# Patient Record
Sex: Female | Born: 1955
Health system: Southern US, Community
[De-identification: ages and names within clinical notes are randomized; demographics above are authoritative.]

## PROBLEM LIST (undated history)

## (undated) DIAGNOSIS — G459 Transient cerebral ischemic attack, unspecified: Secondary | ICD-10-CM

## (undated) DIAGNOSIS — E669 Obesity, unspecified: Secondary | ICD-10-CM

## (undated) DIAGNOSIS — G479 Sleep disorder, unspecified: Secondary | ICD-10-CM

## (undated) DIAGNOSIS — F419 Anxiety disorder, unspecified: Secondary | ICD-10-CM

## (undated) DIAGNOSIS — K5900 Constipation, unspecified: Secondary | ICD-10-CM

## (undated) DIAGNOSIS — I10 Essential (primary) hypertension: Secondary | ICD-10-CM

## (undated) DIAGNOSIS — E785 Hyperlipidemia, unspecified: Secondary | ICD-10-CM

## (undated) DIAGNOSIS — E119 Type 2 diabetes mellitus without complications: Secondary | ICD-10-CM

## (undated) DIAGNOSIS — J45909 Unspecified asthma, uncomplicated: Secondary | ICD-10-CM

## (undated) DIAGNOSIS — M1990 Unspecified osteoarthritis, unspecified site: Secondary | ICD-10-CM

## (undated) DIAGNOSIS — T7840XA Allergy, unspecified, initial encounter: Secondary | ICD-10-CM

## (undated) DIAGNOSIS — D649 Anemia, unspecified: Secondary | ICD-10-CM

## (undated) DIAGNOSIS — M109 Gout, unspecified: Secondary | ICD-10-CM

## (undated) HISTORY — DX: Gout, unspecified: M10.9

## (undated) HISTORY — DX: Anxiety disorder, unspecified: F41.9

## (undated) HISTORY — DX: Allergy, unspecified, initial encounter: T78.40XA

## (undated) HISTORY — PX: OTHER SURGICAL HISTORY: SHX169

## (undated) HISTORY — DX: Unspecified asthma, uncomplicated: J45.909

## (undated) HISTORY — DX: Essential (primary) hypertension: I10

## (undated) HISTORY — DX: Sleep disorder, unspecified: G47.9

## (undated) HISTORY — PX: KNEE ARTHROSCOPY: SUR90

## (undated) HISTORY — DX: Constipation, unspecified: K59.00

## (undated) HISTORY — DX: Obesity, unspecified: E66.9

## (undated) HISTORY — DX: Unspecified osteoarthritis, unspecified site: M19.90

## (undated) HISTORY — PX: COLONOSCOPY: SHX174

## (undated) HISTORY — PX: POLYPECTOMY: SHX149

## (undated) HISTORY — DX: Transient cerebral ischemic attack, unspecified: G45.9

## (undated) HISTORY — DX: Hyperlipidemia, unspecified: E78.5

## (undated) HISTORY — DX: Type 2 diabetes mellitus without complications: E11.9

## (undated) HISTORY — DX: Anemia, unspecified: D64.9

---

## 1998-02-15 ENCOUNTER — Other Ambulatory Visit: Admission: RE | Admit: 1998-02-15 | Discharge: 1998-02-15 | Payer: Self-pay | Admitting: *Deleted

## 1998-03-08 ENCOUNTER — Ambulatory Visit (HOSPITAL_COMMUNITY): Admission: RE | Admit: 1998-03-08 | Discharge: 1998-03-08 | Payer: Self-pay | Admitting: *Deleted

## 1998-03-08 ENCOUNTER — Encounter: Payer: Self-pay | Admitting: *Deleted

## 1998-03-14 ENCOUNTER — Ambulatory Visit (HOSPITAL_COMMUNITY): Admission: RE | Admit: 1998-03-14 | Discharge: 1998-03-14 | Payer: Self-pay | Admitting: *Deleted

## 1998-03-14 ENCOUNTER — Encounter: Payer: Self-pay | Admitting: *Deleted

## 1998-04-09 ENCOUNTER — Encounter: Admission: RE | Admit: 1998-04-09 | Discharge: 1998-07-08 | Payer: Self-pay | Admitting: Family Medicine

## 1999-03-18 ENCOUNTER — Encounter: Payer: Self-pay | Admitting: *Deleted

## 1999-03-18 ENCOUNTER — Ambulatory Visit (HOSPITAL_COMMUNITY): Admission: RE | Admit: 1999-03-18 | Discharge: 1999-03-18 | Payer: Self-pay | Admitting: *Deleted

## 1999-12-05 ENCOUNTER — Emergency Department (HOSPITAL_COMMUNITY): Admission: EM | Admit: 1999-12-05 | Discharge: 1999-12-05 | Payer: Self-pay | Admitting: Emergency Medicine

## 2000-03-23 ENCOUNTER — Ambulatory Visit (HOSPITAL_COMMUNITY): Admission: RE | Admit: 2000-03-23 | Discharge: 2000-03-23 | Payer: Self-pay | Admitting: *Deleted

## 2000-03-23 ENCOUNTER — Encounter: Payer: Self-pay | Admitting: *Deleted

## 2003-10-19 ENCOUNTER — Emergency Department (HOSPITAL_COMMUNITY): Admission: EM | Admit: 2003-10-19 | Discharge: 2003-10-19 | Payer: Self-pay | Admitting: *Deleted

## 2004-03-24 ENCOUNTER — Emergency Department (HOSPITAL_COMMUNITY): Admission: EM | Admit: 2004-03-24 | Discharge: 2004-03-24 | Payer: Self-pay | Admitting: Family Medicine

## 2007-08-10 ENCOUNTER — Ambulatory Visit: Payer: Self-pay | Admitting: Internal Medicine

## 2007-08-25 ENCOUNTER — Encounter: Payer: Self-pay | Admitting: Internal Medicine

## 2007-08-25 ENCOUNTER — Ambulatory Visit: Payer: Self-pay | Admitting: Internal Medicine

## 2007-08-26 ENCOUNTER — Encounter: Payer: Self-pay | Admitting: Internal Medicine

## 2007-09-14 ENCOUNTER — Ambulatory Visit (HOSPITAL_COMMUNITY): Admission: RE | Admit: 2007-09-14 | Discharge: 2007-09-14 | Payer: Self-pay | Admitting: Obstetrics and Gynecology

## 2007-09-14 ENCOUNTER — Encounter (INDEPENDENT_AMBULATORY_CARE_PROVIDER_SITE_OTHER): Payer: Self-pay | Admitting: Obstetrics and Gynecology

## 2009-06-02 ENCOUNTER — Emergency Department (HOSPITAL_COMMUNITY): Admission: EM | Admit: 2009-06-02 | Discharge: 2009-06-02 | Payer: Self-pay | Admitting: Family Medicine

## 2010-02-18 ENCOUNTER — Emergency Department (HOSPITAL_COMMUNITY)
Admission: EM | Admit: 2010-02-18 | Discharge: 2010-02-18 | Payer: Self-pay | Source: Home / Self Care | Admitting: Emergency Medicine

## 2010-06-03 NOTE — Op Note (Signed)
NAMEJAVIANA, Dorothy Torres               ACCOUNT NO.:  [id number]   MEDICAL RECORD NO.:  [id number]          PATIENT TYPE:  AMB   LOCATION:  SDC                           FACILITY:  [facility]   PHYSICIAN:  Carrington Clamp, M.D. DATE OF BIRTH:  08-10-1955   DATE OF PROCEDURE:  09/14/2007  DATE OF DISCHARGE:                               OPERATIVE REPORT   PREOPERATIVE DIAGNOSIS:  Postmenopausal bleeding.   POSTOPERATIVE DIAGNOSES:  1. Postmenopausal bleeding.  2. Endometrial polyp.   PROCEDURE:  Dilation and curettage with hysteroscopy.   SURGEON:  Carrington Clamp, MD.   ASSISTANT:  None.   ANESTHESIA:  General.   FINDINGS:  A 2-cm polyp in the fundus of the uterus that was easily  removed.   SPECIMENS:  Uterine curettings.   DISPOSITION:  To pathology.   ESTIMATED BLOOD LOSS:  Minimal.   IV FLUIDS:  700 mL.   URINE OUTPUT:  Not measured.   COMPLICATIONS:  None.   MEDICATIONS:  None.   Counts were correct x3.   TECHNIQUE:  After adequate general anesthesia was achieved, the patient  was prepped and draped in the usual sterile fashion in dorsal lithotomy  position.  Bladder was emptied with a red rubber catheter.  A speculum  was placed in the vagina.  Single-tooth tenaculum was used to stabilize  the cervix.  The cervix was dilated with Shawnie Pons dilators.  The  hysteroscope was passed into the uterine cavity and the above findings  noted, including the ostia of the uterus.  The polyp forceps were used  to remove the polyp and curettage was then performed using a sharp  curettage.  The hysteroscope was passed again into the uterine cavity to ensure that  all the polyp had been removed and had all instruments withdrawn from  the vagina and the patient tolerated the procedure well.  She returned  to recovery room in stable condition.      Carrington Clamp, M.D.  Electronically Signed     MH/MEDQ  D:  09/14/2007  T:  09/14/2007  Job:  [id number]

## 2010-08-26 ENCOUNTER — Other Ambulatory Visit (HOSPITAL_COMMUNITY): Payer: Self-pay | Admitting: Internal Medicine

## 2010-08-26 DIAGNOSIS — Z1231 Encounter for screening mammogram for malignant neoplasm of breast: Secondary | ICD-10-CM

## 2010-08-29 ENCOUNTER — Ambulatory Visit (HOSPITAL_COMMUNITY)
Admission: RE | Admit: 2010-08-29 | Discharge: 2010-08-29 | Disposition: A | Discharge status: Home / Self Care | Payer: BC Managed Care – PPO | Source: Ambulatory Visit | Attending: Internal Medicine | Admitting: Internal Medicine

## 2010-08-29 DIAGNOSIS — Z1231 Encounter for screening mammogram for malignant neoplasm of breast: Secondary | ICD-10-CM

## 2010-12-30 ENCOUNTER — Other Ambulatory Visit: Payer: Self-pay | Admitting: Obstetrics and Gynecology

## 2011-10-21 ENCOUNTER — Other Ambulatory Visit (HOSPITAL_COMMUNITY): Payer: Self-pay | Admitting: Internal Medicine

## 2011-10-21 DIAGNOSIS — Z1231 Encounter for screening mammogram for malignant neoplasm of breast: Secondary | ICD-10-CM

## 2011-10-27 ENCOUNTER — Encounter: Payer: BC Managed Care – PPO | Attending: Internal Medicine | Admitting: *Deleted

## 2011-10-27 DIAGNOSIS — Z713 Dietary counseling and surveillance: Secondary | ICD-10-CM | POA: Insufficient documentation

## 2011-10-27 DIAGNOSIS — E119 Type 2 diabetes mellitus without complications: Secondary | ICD-10-CM | POA: Insufficient documentation

## 2011-10-28 ENCOUNTER — Encounter: Payer: Self-pay | Admitting: *Deleted

## 2011-10-28 NOTE — Progress Notes (Signed)
  Patient was seen on 10/27/2011 for the first of a series of three diabetes self-management courses at the [facility]. The following learning objectives were met by the patient during this course:   Defines the role of glucose and insulin  Identifies type of diabetes and pathophysiology  Defines the diagnostic criteria for diabetes and prediabetes  States the risk factors for Type 2 Diabetes  States the symptoms of Type 2 Diabetes  Defines Type 2 Diabetes treatment goals  Defines Type 2 Diabetes treatment options  States the rationale for glucose monitoring  Identifies A1C, glucose targets, and testing times  Identifies proper sharps disposal  Defines the purpose of a diabetes food plan  Identifies carbohydrate food groups  Defines effects of carbohydrate foods on glucose levels  Identifies carbohydrate choices/grams/food labels  States benefits of physical activity and effect on glucose  Review of suggested activity guidelines  Handouts given during class include:  Type 2 Diabetes: Basics Book  My Food Plan Book  Food and Activity Log  Follow-Up Plan: Core Class 2

## 2011-10-28 NOTE — Patient Instructions (Signed)
Goals:  Follow Diabetes Meal Plan as instructed  Eat 3 meals and 2 snacks, every 3-5 hrs  Limit carbohydrate intake to 30-45 grams carbohydrate/meal  Limit carbohydrate intake to 0-15 grams carbohydrate/snack  Add lean protein foods to meals/snacks  Monitor glucose levels as instructed by your doctor  Aim for 15-30 mins of physical activity daily  Bring food record and glucose log to your next nutrition visit   

## 2011-11-03 ENCOUNTER — Ambulatory Visit (HOSPITAL_COMMUNITY): Payer: BC Managed Care – PPO

## 2011-11-16 ENCOUNTER — Ambulatory Visit (HOSPITAL_COMMUNITY): Payer: BC Managed Care – PPO | Attending: Internal Medicine

## 2011-11-17 ENCOUNTER — Encounter: Payer: BC Managed Care – PPO | Admitting: *Deleted

## 2011-11-17 DIAGNOSIS — E119 Type 2 diabetes mellitus without complications: Secondary | ICD-10-CM

## 2011-11-18 NOTE — Patient Instructions (Signed)
Goals:  Follow Diabetes Meal Plan as instructed  Eat 3 meals and 2 snacks, every 3-5 hrs  Limit carbohydrate intake to 30-45 grams carbohydrate/meal  Limit carbohydrate intake to 15 grams carbohydrate/snack  Add lean protein foods to meals/snacks  Monitor glucose levels as instructed by your doctor  Aim for 30 mins of physical activity daily  Bring food record and glucose log to your next nutrition visit 

## 2011-11-18 NOTE — Progress Notes (Signed)
  Patient was seen on 11/17/11 for the second of a series of three diabetes self-management courses at the [facility]. The following learning objectives were met by the patient during this course:   Explain basic nutrition maintenance and quality assurance  Describe causes, symptoms and treatment of hypoglycemia and hyperglycemia  Explain how to manage diabetes during illness  Describe the importance of good nutrition for health and healthy eating strategies  List strategies to follow meal plan when dining out  Describe the effects of alcohol on glucose and how to use it safely  Describe problem solving skills for day-to-day glucose challenges  Describe strategies to use when treatment plan needs to change  Identify important factors involved in successful weight loss  Describe ways to remain physically active  Describe the impact of regular activity on insulin resistance   Handouts given in class:  Refrigerator magnet for Sick Day Guidelines  [facility] Oral medication/insulin handout  Follow-Up Plan: Patient will attend the final class of the ADA Diabetes Self-Care Education.   

## 2011-11-19 ENCOUNTER — Ambulatory Visit: Payer: BC Managed Care – PPO

## 2011-12-01 ENCOUNTER — Encounter: Payer: BC Managed Care – PPO | Attending: Internal Medicine | Admitting: Dietician

## 2011-12-01 DIAGNOSIS — E119 Type 2 diabetes mellitus without complications: Secondary | ICD-10-CM | POA: Insufficient documentation

## 2011-12-01 DIAGNOSIS — IMO0001 Reserved for inherently not codable concepts without codable children: Secondary | ICD-10-CM

## 2011-12-01 DIAGNOSIS — Z713 Dietary counseling and surveillance: Secondary | ICD-10-CM | POA: Insufficient documentation

## 2011-12-02 NOTE — Progress Notes (Signed)
  Patient was seen on 12/01/2011 for the third of a series of three diabetes self-management courses at the [facility]. The following learning objectives were met by the patient during this course:    Describe how diabetes changes over time   Identify diabetes complications and ways to prevent them   Describe strategies that can promote heart health including lowering blood pressure and cholesterol   Describe strategies to lower dietary fat and sodium in the diet   Identify physical activities that benefit cardiovascular health   Evaluate success in meeting personal goal   Describe the belief that they can live successfully with diabetes day to day   Establish 2-3 goals that they will plan to diligently work on until they return for the free 29-month follow-up visit  The following handouts were given in class:  3 Month Follow Up Visit handout  Goal setting handout  Class evaluation form  Your patient has established the following 3 month goals for diabetes self-care:  Reduce fat in my diet by eating less fried foods at two or more meals a day.  Be active 30 minutes or more 5 times a week.  To manage stress practice breathing techniques, puzzles at bedtime, and saying "No".  Follow-Up Plan: Patient will attend a 3 month follow-up visit for diabetes self-management education.

## 2011-12-11 ENCOUNTER — Ambulatory Visit (HOSPITAL_COMMUNITY)
Admission: RE | Admit: 2011-12-11 | Discharge: 2011-12-11 | Disposition: A | Discharge status: Home / Self Care | Payer: BC Managed Care – PPO | Source: Ambulatory Visit | Attending: Internal Medicine | Admitting: Internal Medicine

## 2011-12-11 DIAGNOSIS — Z1231 Encounter for screening mammogram for malignant neoplasm of breast: Secondary | ICD-10-CM

## 2012-03-01 ENCOUNTER — Telehealth: Payer: Self-pay | Admitting: Medical Oncology

## 2012-03-01 NOTE — Telephone Encounter (Signed)
erroneous

## 2012-03-08 ENCOUNTER — Ambulatory Visit: Payer: BC Managed Care – PPO | Admitting: Dietician

## 2012-11-15 ENCOUNTER — Other Ambulatory Visit (HOSPITAL_COMMUNITY): Payer: Self-pay | Admitting: Internal Medicine

## 2012-11-15 DIAGNOSIS — Z1231 Encounter for screening mammogram for malignant neoplasm of breast: Secondary | ICD-10-CM

## 2012-12-13 ENCOUNTER — Ambulatory Visit (HOSPITAL_COMMUNITY): Payer: BC Managed Care – PPO | Attending: Internal Medicine

## 2013-04-17 ENCOUNTER — Other Ambulatory Visit: Payer: Self-pay | Admitting: Internal Medicine

## 2013-04-17 DIAGNOSIS — Z1231 Encounter for screening mammogram for malignant neoplasm of breast: Secondary | ICD-10-CM

## 2013-04-26 ENCOUNTER — Ambulatory Visit: Payer: BC Managed Care – PPO

## 2013-07-20 ENCOUNTER — Emergency Department (HOSPITAL_COMMUNITY): Payer: 59

## 2013-07-20 ENCOUNTER — Emergency Department (HOSPITAL_COMMUNITY)
Admission: EM | Admit: 2013-07-20 | Discharge: 2013-07-21 | Disposition: A | Discharge status: Home / Self Care | Payer: 59 | Attending: Emergency Medicine | Admitting: Emergency Medicine

## 2013-07-20 ENCOUNTER — Encounter (HOSPITAL_COMMUNITY): Payer: Self-pay | Admitting: Emergency Medicine

## 2013-07-20 DIAGNOSIS — R4789 Other speech disturbances: Secondary | ICD-10-CM | POA: Diagnosis not present

## 2013-07-20 DIAGNOSIS — I10 Essential (primary) hypertension: Secondary | ICD-10-CM | POA: Insufficient documentation

## 2013-07-20 DIAGNOSIS — IMO0002 Reserved for concepts with insufficient information to code with codable children: Secondary | ICD-10-CM | POA: Diagnosis not present

## 2013-07-20 DIAGNOSIS — R479 Unspecified speech disturbances: Secondary | ICD-10-CM

## 2013-07-20 DIAGNOSIS — R209 Unspecified disturbances of skin sensation: Secondary | ICD-10-CM | POA: Diagnosis not present

## 2013-07-20 DIAGNOSIS — Z79899 Other long term (current) drug therapy: Secondary | ICD-10-CM | POA: Insufficient documentation

## 2013-07-20 DIAGNOSIS — R4781 Slurred speech: Secondary | ICD-10-CM

## 2013-07-20 DIAGNOSIS — E669 Obesity, unspecified: Secondary | ICD-10-CM | POA: Diagnosis not present

## 2013-07-20 DIAGNOSIS — R071 Chest pain on breathing: Secondary | ICD-10-CM | POA: Insufficient documentation

## 2013-07-20 DIAGNOSIS — E119 Type 2 diabetes mellitus without complications: Secondary | ICD-10-CM | POA: Insufficient documentation

## 2013-07-20 DIAGNOSIS — J45909 Unspecified asthma, uncomplicated: Secondary | ICD-10-CM | POA: Diagnosis not present

## 2013-07-20 DIAGNOSIS — E785 Hyperlipidemia, unspecified: Secondary | ICD-10-CM | POA: Insufficient documentation

## 2013-07-20 LAB — CBG MONITORING, ED
GLUCOSE-CAPILLARY: 76 mg/dL (ref 70–99)
Glucose-Capillary: 80 mg/dL (ref 70–99)
Glucose-Capillary: 102 mg/dL — ABNORMAL HIGH (ref 70–99)

## 2013-07-20 LAB — DIFFERENTIAL
BASOS PCT: 0 % (ref 0–1)
Basophils Absolute: 0 10*3/uL (ref 0.0–0.1)
Eosinophils Absolute: 0.1 10*3/uL (ref 0.0–0.7)
Eosinophils Relative: 1 % (ref 0–5)
Lymphocytes Relative: 44 % (ref 12–46)
Lymphs Abs: 3.5 10*3/uL (ref 0.7–4.0)
MONO ABS: 0.5 10*3/uL (ref 0.1–1.0)
MONOS PCT: 7 % (ref 3–12)
Neutro Abs: 3.9 10*3/uL (ref 1.7–7.7)
Neutrophils Relative %: 48 % (ref 43–77)

## 2013-07-20 LAB — I-STAT TROPONIN, ED
TROPONIN I, POC: 0.01 ng/mL (ref 0.00–0.08)
TROPONIN I, POC: 0 ng/mL (ref 0.00–0.08)

## 2013-07-20 LAB — URINALYSIS, ROUTINE W REFLEX MICROSCOPIC
BILIRUBIN URINE: NEGATIVE
Glucose, UA: 500 mg/dL — AB
Hgb urine dipstick: NEGATIVE
Ketones, ur: NEGATIVE mg/dL
Leukocytes, UA: NEGATIVE
Nitrite: NEGATIVE
Protein, ur: NEGATIVE mg/dL
SPECIFIC GRAVITY, URINE: 1.009 (ref 1.005–1.030)
UROBILINOGEN UA: 1 mg/dL (ref 0.0–1.0)
pH: 6 (ref 5.0–8.0)

## 2013-07-20 LAB — COMPREHENSIVE METABOLIC PANEL
ALT: 16 U/L (ref 0–35)
ANION GAP: 15 (ref 5–15)
AST: 15 U/L (ref 0–37)
Albumin: 3.7 g/dL (ref 3.5–5.2)
Alkaline Phosphatase: 68 U/L (ref 39–117)
BILIRUBIN TOTAL: 0.3 mg/dL (ref 0.3–1.2)
BUN: 13 mg/dL (ref 6–23)
CO2: 26 meq/L (ref 19–32)
CREATININE: 0.93 mg/dL (ref 0.50–1.10)
Calcium: 9.7 mg/dL (ref 8.4–10.5)
Chloride: 101 mEq/L (ref 96–112)
GFR, EST AFRICAN AMERICAN: 77 mL/min — AB (ref >90)
GFR, EST NON AFRICAN AMERICAN: 66 mL/min — AB (ref >90)
Glucose, Bld: 89 mg/dL (ref 70–99)
POTASSIUM: 4 meq/L (ref 3.7–5.3)
Sodium: 142 mEq/L (ref 137–147)
Total Protein: 7.3 g/dL (ref 6.0–8.3)

## 2013-07-20 LAB — I-STAT CHEM 8, ED
BUN: 12 mg/dL (ref 6–23)
CALCIUM ION: 1.18 mmol/L (ref 1.12–1.23)
CHLORIDE: 104 meq/L (ref 96–112)
Creatinine, Ser: 1 mg/dL (ref 0.50–1.10)
GLUCOSE: 87 mg/dL (ref 70–99)
HEMATOCRIT: 42 % (ref 36.0–46.0)
Hemoglobin: 14.3 g/dL (ref 12.0–15.0)
Potassium: 3.8 mEq/L (ref 3.7–5.3)
Sodium: 141 mEq/L (ref 137–147)
TCO2: 25 mmol/L (ref 0–100)

## 2013-07-20 LAB — PRO B NATRIURETIC PEPTIDE: Pro B Natriuretic peptide (BNP): 24.9 pg/mL (ref 0–125)

## 2013-07-20 LAB — PROTIME-INR
INR: 1.01 (ref 0.00–1.49)
Prothrombin Time: 13.3 seconds (ref 11.6–15.2)

## 2013-07-20 LAB — RAPID URINE DRUG SCREEN, HOSP PERFORMED
Amphetamines: NOT DETECTED
BARBITURATES: NOT DETECTED
Benzodiazepines: NOT DETECTED
COCAINE: NOT DETECTED
Opiates: NOT DETECTED
TETRAHYDROCANNABINOL: NOT DETECTED

## 2013-07-20 LAB — CBC
HEMATOCRIT: 38.8 % (ref 36.0–46.0)
HEMOGLOBIN: 12.9 g/dL (ref 12.0–15.0)
MCH: 27.4 pg (ref 26.0–34.0)
MCHC: 33.2 g/dL (ref 30.0–36.0)
MCV: 82.6 fL (ref 78.0–100.0)
Platelets: 226 10*3/uL (ref 150–400)
RBC: 4.7 MIL/uL (ref 3.87–5.11)
RDW: 13.7 % (ref 11.5–15.5)
WBC: 8 10*3/uL (ref 4.0–10.5)

## 2013-07-20 LAB — APTT: APTT: 34 s (ref 24–37)

## 2013-07-20 LAB — ETHANOL: Alcohol, Ethyl (B): <11 mg/dL (ref 0–11)

## 2013-07-20 NOTE — ED Notes (Signed)
Patient back from MRI.

## 2013-07-20 NOTE — ED Provider Notes (Signed)
CSN: [id number]     Arrival date & time 07/20/13  1721 History   First MD Initiated Contact with Patient 07/20/13 1730     Chief Complaint  Patient presents with  . Chest Pain     (Consider location/radiation/quality/duration/timing/severity/associated sxs/prior Treatment) HPI 58 year old female presents with stuttering speech over the last 3 hours. She states started at 2:30 PM to the best of her knowledge. She was at work when this started. She's been noticing some tingling in her left arm with pain. Does not feel that is weaker than normal. Denies any headaches. States she also had sharp chest pain that started on the way over to the ER. Does not worsen with inspiration. No shortness of breath. She reports a history of TIA in the past, but does not remember the exact symptom she had. Her speech has not worsened or improved since onset.  Past Medical History  Diagnosis Date  . Asthma   . Hypertension   . Hyperlipidemia   . Diabetes mellitus without complication   . Obesity    Past Surgical History  Procedure Laterality Date  . Knee arthroscopy     No family history on file. History  Substance Use Topics  . Smoking status: Never Smoker   . Smokeless tobacco: Never Used  . Alcohol Use: No   OB History   Grav Para Term Preterm Abortions TAB SAB Ect Mult Living                 Review of Systems  Constitutional: Negative for fever.  Eyes: Negative for visual disturbance.  Respiratory: Negative for shortness of breath.   Cardiovascular: Positive for chest pain. Negative for leg swelling.  Gastrointestinal: Negative for vomiting.  Musculoskeletal: Negative for neck pain.  Neurological: Positive for speech difficulty and numbness. Negative for weakness and headaches.  All other systems reviewed and are negative.     Allergies  Statins  Home Medications   Prior to Admission medications   Medication Sig Start Date End Date Taking? Authorizing Provider  albuterol  (PROVENTIL HFA;VENTOLIN HFA) 108 (90 BASE) MCG/ACT inhaler Inhale 2 puffs into the lungs 2 (two) times daily.    Historical Provider, MD  beclomethasone (QVAR) 80 MCG/ACT inhaler Inhale 1 puff into the lungs 2 (two) times daily.    Historical Provider, MD  cefdinir (OMNICEF) 300 MG capsule Take 300 mg by mouth 2 (two) times daily.    Historical Provider, MD  fenofibrate 160 MG tablet Take 160 mg by mouth daily.    Historical Provider, MD  iron polysaccharides (NIFEREX) 150 MG capsule Take 150 mg by mouth daily.    Historical Provider, MD   BP 154/91  Temp(Src) 98.1 F (36.7 C) (Oral)  Resp 17  SpO2 100% Physical Exam  Nursing note and vitals reviewed. Constitutional: She is oriented to person, place, and time. She appears well-developed and well-nourished.  HENT:  Head: Normocephalic and atraumatic.  Right Ear: External ear normal.  Left Ear: External ear normal.  Nose: Nose normal.  Eyes: EOM are normal. Pupils are equal, round, and reactive to light. Right eye exhibits no discharge. Left eye exhibits no discharge.  Cardiovascular: Normal rate, regular rhythm and normal heart sounds.   Pulmonary/Chest: Effort normal and breath sounds normal. She exhibits tenderness (anterior chest wall tenderness).  Abdominal: Soft. There is no tenderness.  Musculoskeletal:  Left arm hurts diffusely with ROM and movement during strength testing  Neurological: She is alert and oriented to person, place, and time.  CN 2-12 grossly intact. 5/5 strength in all 4 extremities  Skin: Skin is warm and dry.  Psychiatric: Her speech is slurred.    ED Course  Procedures (including critical care time) Labs Review Labs Reviewed  COMPREHENSIVE METABOLIC PANEL - Abnormal; Notable for the following:    GFR calc non Af Amer 66 (*)    GFR calc Af Amer 77 (*)    All other components within normal limits  URINALYSIS, ROUTINE W REFLEX MICROSCOPIC - Abnormal; Notable for the following:    Glucose, UA 500 (*)     All other components within normal limits  ETHANOL  PROTIME-INR  APTT  CBC  DIFFERENTIAL  URINE RAPID DRUG SCREEN (HOSP PERFORMED)  PRO B NATRIURETIC PEPTIDE  I-STAT CHEM 8, ED  I-STAT TROPOININ, ED  I-STAT TROPOININ, ED  CBG MONITORING, ED  CBG MONITORING, ED  I-STAT TROPOININ, ED    Imaging Review Ct Head Wo Contrast  07/20/2013   ADDENDUM REPORT: 07/20/2013 18:53  ADDENDUM: Findings discussed with and reconfirmed by Lester Kinsmanr.Leslie Reynolds on7/2/2015at6:10 PM.   Electronically Signed   By: Awilda Metroourtnay  Bloomer   On: 07/20/2013 18:53   07/20/2013   CLINICAL DATA:  Weakness at work, slurred speech and headache.  EXAM: CT HEAD WITHOUT CONTRAST  TECHNIQUE: Contiguous axial images were obtained from the base of the skull through the vertex without intravenous contrast.  COMPARISON:  None.  FINDINGS: The ventricles and sulci are normal. No intraparenchymal hemorrhage, mass effect nor midline shift. No acute large vascular territory infarcts.  No abnormal extra-axial fluid collections. Basal cisterns are patent.  No skull fracture. The included ocular globes and orbital contents are non-suspicious. The mastoid aircells and included paranasal sinuses are well-aerated. Soft tissue within the right external auditory canal may reflect cerumen.  IMPRESSION: No acute intracranial process; normal noncontrast CT of the head. If clinical suspicion persists for acute ischemia, MRI of the brain with diffusion-weighted sequences would be more sensitive.  Electronically Signed: By: Awilda Metroourtnay  Bloomer On: 07/20/2013 18:04   Dg Chest Portable 1 View  07/20/2013   CLINICAL DATA:  Chest pain and slurred speech  EXAM: PORTABLE CHEST - 1 VIEW  COMPARISON:  06/02/2009  FINDINGS: There is mild cardiac enlargement. The lung volumes are low and there is pulmonary venous congestion. No pleural effusion or edema. No airspace consolidation.  IMPRESSION: 1. Pulmonary vascular congestion.   Electronically Signed   By: Signa Kellaylor  Stroud M.D.    On: 07/20/2013 18:36     EKG Interpretation   Date/Time:  Thursday July 20 2013 17:25:58 EDT Ventricular Rate:  59 PR Interval:    QRS Duration: 76 QT Interval:  410 QTC Calculation: 405 R Axis:   29 Text Interpretation:  Atrial flutter with 4:1 A-V conduction Abnormal ECG  no ST/T changes Confirmed by Aaylah Pokorny  MD, Anurag Scarfo ([id number]) on 07/20/2013  6:09:32 PM      MDM   Final diagnoses:  Slurred speech    5:40 PM Code stroke called due to patient's reported time of 2:30 PM as onset. Her only localizable deficit is stuttering speech on my exam. Otherwise appears neurologically intact. Code stroke called, will get CT head, as well as labs and chest x-ray given her acute onset chest pain.  Patient's Workup is unremarkable. Neuro feels if MRI is negative, does not need CVA/TIA workup given atypical story. CP is also atypical and resolved in ED without intervention. Highly doubt these are connected, and doubt dissection. D/w her PCP, Dr. Timothy Lassousso. Recommends MRI  and delta troponin. If both are neg, prefers outpatient workup and will follow closely in next couple of days for outpatient stress. If positive will admit. Care transferred with MRI pending.     Audree Camel, MD 07/20/13 2106

## 2013-07-20 NOTE — ED Notes (Addendum)
Patient roomed at this time.

## 2013-07-20 NOTE — ED Notes (Signed)
Per Patient: Pt reports she started having CP that radiated to her L arm about 45 minutes ago. Pt also reports some slurred speech. Hx of TIA. Pt BP was elevated today at work

## 2013-07-20 NOTE — ED Notes (Signed)
Orange juice and graham crackers and peanut butter given  Dr. Criss AlvineGoldston notified

## 2013-07-20 NOTE — Code Documentation (Signed)
Patient arrived via private vehicle.  Code Stroke called at 1742.  IV started/labs drawn.  Head CT done Patient states that she started not feeling well about 2pm while she was at work.  She states that she felt like she would pass out.  She stated that her speech was also slurred.  She thought her sugar was low so she took a couple cups of OJ. Then she checked her blood sugar it was 99.  She left work with family after which she stopped at a [facility] to check her BP it was SBP 160s.  Per son she was dizzy and off balance  at that time.  She arrived at the hospital at 1724, complaining of slurred speech and HA.  CBG at 1745 was 80.   NIHSS 2, right leg drift and mild dysarthria.  Dr Thad Rangereynolds at bedside to assess patient.  RN to recheck CBG.

## 2013-07-20 NOTE — ED Notes (Signed)
Patient denies any further CP other then what she had for about 2 mins while at work about 2pm

## 2013-07-20 NOTE — ED Provider Notes (Signed)
8:15 AM Handoff from Dr. Criss AlvineGoldston at shift change. Patient with episode of stuttering speech and chest pain earlier tonight that resolved. Patient came in as a code stroke but this was canceled due to improvement of symptoms.  Plan: Dr. Criss AlvineGoldston spoke with patient's PCP, Dr. Timothy Lassousso. If MRI brain is negative and the troponin is negative, patient can be discharged home with close PCP followup. She is to stop taking her Invokana until otherwise directed by her PCP.  11:37 PM Results are negative. Patient informed. She is back to baseline per her self and family. Patient understands results and agrees with plan. D/c to home.   Renne CriglerJoshua Chancey Cullinane, PA-C 07/20/13 2338

## 2013-07-20 NOTE — Consult Note (Signed)
Referring Physician: Criss Alvine    Chief Complaint: Difficulty with speech and bilateral upper extremity sensory disturbances  HPI: SAANVI HAKALA is an 58 y.o. female who reports that she was at work today and began to feel poorly.  Describes some dizziness.  Thought it was her blood sugar and had some OJ.  CBG checked after OJ and was 99.  Patient then developed slurred speech and noted tingling in her right arm and numbness in her left arm.  Patient was brought in for evaluation at that time.  EN route complained of chest pain.  CBG on arrival of 80.    Date last known well: Date: 07/20/2013 Time last known well: Time: 14:00 tPA Given: No: Not felt to be a stroke, minimal symptoms  Past Medical History  Diagnosis Date  . Asthma   . Hypertension   . Hyperlipidemia   . Diabetes mellitus without complication   . Obesity     Past Surgical History  Procedure Laterality Date  . Knee arthroscopy      Family history: Diabetes, renal failure, CHF  Social History:  reports that she has never smoked. She has never used smokeless tobacco. She reports that she does not drink alcohol. Her drug history is not on file.  Allergies:  Allergies  Allergen Reactions  . Statins Other (See Comments)    Leg pain    Medications: I have reviewed the patient's current medications. Prior to Admission:  Current outpatient prescriptions: albuterol (PROVENTIL HFA;VENTOLIN HFA) 108 (90 BASE) MCG/ACT inhaler, Inhale 2 puffs into the lungs 2 (two) times daily., Disp: , Rfl: ;  beclomethasone (QVAR) 80 MCG/ACT inhaler, Inhale 1 puff into the lungs 2 (two) times daily., Disp: , Rfl: ;   cefdinir (OMNICEF) 300 MG capsule, Take 300 mg by mouth 2 (two) times daily., Disp: , Rfl: ;   fenofibrate 160 MG tablet, Take 160 mg by mouth daily., Disp: , Rfl:  iron polysaccharides (NIFEREX) 150 MG capsule, Take 150 mg by mouth daily., Disp: , Rfl:   ROS: History obtained from the patient  General ROS: negative for -  chills, fatigue, fever, night sweats, weight gain or weight loss Psychological ROS: negative for - behavioral disorder, hallucinations, memory difficulties, mood swings or suicidal ideation Ophthalmic ROS: negative for - blurry vision, double vision, eye pain or loss of vision ENT ROS: negative for - epistaxis, nasal discharge, oral lesions, sore throat, tinnitus Allergy and Immunology ROS: negative for - hives or itchy/watery eyes Hematological and Lymphatic ROS: negative for - bleeding problems, bruising or swollen lymph nodes Endocrine ROS: negative for - galactorrhea, hair pattern changes, polydipsia/polyuria or temperature intolerance Respiratory ROS: negative for - cough, hemoptysis, shortness of breath or wheezing Cardiovascular ROS: chest pain Gastrointestinal ROS: negative for - abdominal pain, diarrhea, hematemesis, nausea/vomiting or stool incontinence Genito-Urinary ROS: negative for - dysuria, hematuria, incontinence or urinary frequency/urgency Musculoskeletal ROS: negative for - joint swelling or muscular weakness Neurological ROS: as noted in HPI Dermatological ROS: negative for rash and skin lesion changes  Physical Examination: Blood pressure 150/75, pulse 59, temperature 98.1 F (36.7 C), temperature source Oral, resp. rate 21, SpO2 100.00%.  Neurologic Examination: Mental Status: Alert, oriented, thought content appropriate.  Speech halting but fluent without evidence of aphasia.  Able to follow 3 step commands without difficulty. Cranial Nerves: II: Discs flat bilaterally; Visual fields grossly normal, pupils equal, round, reactive to light and accommodation III,IV, VI: ptosis not present, extra-ocular motions intact bilaterally V,VII: smile symmetric, facial light touch  sensation normal bilaterally VIII: hearing normal bilaterally IX,X: gag reflex present XI: bilateral shoulder shrug XII: midline tongue extension Motor: Right : Upper extremity   5/5 no  drift   Left:     Upper extremity   5/5 no drift  Lower extremity   5/5     Lower extremity   5/5 Tone and bulk:normal tone throughout; no atrophy noted Sensory: Light touch intact throughout, bilaterally.  Pinprick difficult for patient to assess in the upper extremities.   Deep Tendon Reflexes: 2+ and symmetric with absent AJ's bilaterally Plantars: Right: downgoing   Left: downgoing Cerebellar: normal finger-to-nose and normal heel-to-shin test Gait: Unable to test CV: pulses palpable throughout     Laboratory Studies:  Basic Metabolic Panel:  Recent Labs Lab 07/20/13 1747 07/20/13 1759  NA 142 141  K 4.0 3.8  CL 101 104  CO2 26  --   GLUCOSE 89 87  BUN 13 12  CREATININE 0.93 1.00  CALCIUM 9.7  --     Liver Function Tests:  Recent Labs Lab 07/20/13 1747  AST 15  ALT 16  ALKPHOS 68  BILITOT 0.3  PROT 7.3  ALBUMIN 3.7   No results found for this basename: LIPASE, AMYLASE,  in the last 168 hours No results found for this basename: AMMONIA,  in the last 168 hours  CBC:  Recent Labs Lab 07/20/13 1747 07/20/13 1759  WBC 8.0  --   NEUTROABS 3.9  --   HGB 12.9 14.3  HCT 38.8 42.0  MCV 82.6  --   PLT 226  --     Cardiac Enzymes: No results found for this basename: CKTOTAL, CKMB, CKMBINDEX, TROPONINI,  in the last 168 hours  BNP: No components found with this basename: POCBNP,   CBG:  Recent Labs Lab 07/20/13 1742  GLUCAP 80    Microbiology: No results found for this or any previous visit.  Coagulation Studies:  Recent Labs  07/20/13 1747  LABPROT 13.3  INR 1.01    Urinalysis:   Recent Labs Lab 07/20/13 1744  COLORURINE YELLOW  LABSPEC 1.009  PHURINE 6.0  GLUCOSEU 500*  HGBUR NEGATIVE  BILIRUBINUR NEGATIVE  KETONESUR NEGATIVE  PROTEINUR NEGATIVE  UROBILINOGEN 1.0  NITRITE NEGATIVE  LEUKOCYTESUR NEGATIVE    Lipid Panel: No results found for this basename: chol,  trig,  hdl,  cholhdl,  vldl,  ldlcalc    HgbA1C:  No  results found for this basename: HGBA1C    Urine Drug Screen:      Component Value Date/Time   LABOPIA NONE DETECTED 07/20/2013 1744    Alcohol Level:   Recent Labs Lab 07/20/13 1747  ETH <11    Other results: EKG: sinus bradycardia at 59 bpm.  Imaging: Ct Head Wo Contrast  07/20/2013   CLINICAL DATA:  Weakness at work, slurred speech and headache.  EXAM: CT HEAD WITHOUT CONTRAST  TECHNIQUE: Contiguous axial images were obtained from the base of the skull through the vertex without intravenous contrast.  COMPARISON:  None.  FINDINGS: The ventricles and sulci are normal. No intraparenchymal hemorrhage, mass effect nor midline shift. No acute large vascular territory infarcts.  No abnormal extra-axial fluid collections. Basal cisterns are patent.  No skull fracture. The included ocular globes and orbital contents are non-suspicious. The mastoid aircells and included paranasal sinuses are well-aerated. Soft tissue within the right external auditory canal may reflect cerumen.  IMPRESSION: No acute intracranial process; normal noncontrast CT of the head. If clinical suspicion persists for  acute ischemia, MRI of the brain with diffusion-weighted sequences would be more sensitive.   Electronically Signed   By: Awilda Metroourtnay  Bloomer   On: 07/20/2013 18:04    Assessment: 58 y.o. female presenting with complaints of slurred speech and upper extremity sensory deficits.  Head CT reviewed and shows no acute changes.  Patient on no antiplatelet therapy at home.  Concerned about blood sugar and suspect this may be causing the patient's symptoms as opposed to an acute ischemic event.  Patient does have vascular risk factors though.  Would rule out acute infarct with further testing.  Stroke Risk Factors - diabetes mellitus, hyperlipidemia and hypertension  Plan: 1. MRI, MRA  of the brain without contrast.  Would not initiate stroke work up unless imaging is diagnostic of an acute event.   2 Prophylactic  therapy-Antiplatelet med: Aspirin - dose 81mg  daily 3. Telemetry monitoring 4. Frequent neuro checks 5. Blood sugar monitoring   Thana FarrLeslie Myreon Wimer, MD [facility] [phone] 07/20/2013, 6:38 PM

## 2013-07-20 NOTE — ED Notes (Signed)
Dr. Criss AlvineGoldston notified of patient's slurred speech, stated he would be in to see the patient asap.

## 2013-07-20 NOTE — Discharge Instructions (Signed)
Please read and follow all provided instructions.  Your diagnoses today include:  1. Slurred speech   2. Speech problem     Tests performed today include:  Blood counts and electrolytes  MRI of your brain - does not show a stroke  Blood test for heart attack - no sign of heart attack  Vital signs. See below for your results today.   Medications prescribed:   None  Take any prescribed medications only as directed.  Home care instructions:  Follow any educational materials contained in this packet.  Do not take your Invokana until told to do so by Dr. Timothy Lassousso.   BE VERY CAREFUL not to take multiple medicines containing Tylenol (also called acetaminophen). Doing so can lead to an overdose which can damage your liver and cause liver failure and possibly death.   Follow-up instructions: Please follow-up with your primary care provider in the next 3 days for further evaluation of your symptoms.   Return instructions:   Please return to the Emergency Department if you experience worsening symptoms.  Return if you have weakness in your arms or legs, slurred speech, trouble walking or talking, confusion, or trouble with your balance.   Please return if you have any other emergent concerns.  Additional Information:  Your vital signs today were: BP 148/68   Pulse 86   Temp(Src) 98.1 F (36.7 C) (Oral)   Resp 20   SpO2 100% If your blood pressure (BP) was elevated above 135/85 this visit, please have this repeated by your doctor within one month. --------------

## 2013-07-20 NOTE — ED Notes (Signed)
Order completed by accident  reordered

## 2013-07-20 NOTE — ED Notes (Signed)
Patient to MRI.

## 2013-07-21 NOTE — ED Provider Notes (Signed)
Medical screening examination/treatment/procedure(s) were performed by non-physician practitioner and as supervising physician I was immediately available for consultation/collaboration.   EKG Interpretation   Date/Time:  Thursday July 20 2013 17:25:58 EDT Ventricular Rate:  59 PR Interval:    QRS Duration: 76 QT Interval:  410 QTC Calculation: 405 R Axis:   29 Text Interpretation:  Atrial flutter with 4:1 A-V conduction Abnormal ECG  no ST/T changes Confirmed by Criss AlvineGOLDSTON  MD, Zen Cedillos ([id number]) on 07/20/2013  6:09:32 PM        Audree CamelScott T Symia Herdt, MD 07/21/13 1334

## 2014-07-02 ENCOUNTER — Encounter: Payer: Self-pay | Admitting: Internal Medicine

## 2014-07-06 ENCOUNTER — Ambulatory Visit (INDEPENDENT_AMBULATORY_CARE_PROVIDER_SITE_OTHER): Payer: 59 | Admitting: Neurology

## 2014-07-06 ENCOUNTER — Encounter: Payer: Self-pay | Admitting: Neurology

## 2014-07-06 VITALS — BP 142/82 | HR 67 | Ht 60.0 in | Wt 206.0 lb

## 2014-07-06 DIAGNOSIS — E538 Deficiency of other specified B group vitamins: Secondary | ICD-10-CM | POA: Diagnosis not present

## 2014-07-06 DIAGNOSIS — G4719 Other hypersomnia: Secondary | ICD-10-CM

## 2014-07-06 DIAGNOSIS — R7989 Other specified abnormal findings of blood chemistry: Secondary | ICD-10-CM

## 2014-07-06 DIAGNOSIS — E669 Obesity, unspecified: Secondary | ICD-10-CM

## 2014-07-06 DIAGNOSIS — R351 Nocturia: Secondary | ICD-10-CM | POA: Diagnosis not present

## 2014-07-06 DIAGNOSIS — R0683 Snoring: Secondary | ICD-10-CM

## 2014-07-06 DIAGNOSIS — G479 Sleep disorder, unspecified: Secondary | ICD-10-CM | POA: Insufficient documentation

## 2014-07-06 NOTE — Patient Instructions (Signed)

## 2014-07-06 NOTE — Progress Notes (Signed)
Subjective:    Patient ID: Dorothy Torres is a 59 y.o. female.  HPI     Huston Foley, MD, PhD [facility] [address] [address] [place], [place] [address]  Dear Dr. Timothy Lasso,  I saw your patient, Dorothy Torres, upon your kind request in my neurologic clinic today for initial consultation of her sleep disorder, in particular, evaluation of obstructive sleep apnea. The patient is unaccompanied today. As you know, Dorothy Torres is a 59 year old right-handed woman with an underlying medical history of asthma, hypertension, hyperlipidemia, diabetes, obesity, who had a sleep study in 2008. Prior test results are not available for my review. She reports that she did not have any significant obstructive sleep apnea and was not told to try CPAP therapy. Nevertheless, she was told to sleep in a more upright position and on her sides as I understand. She does report snoring and excessive daytime somnolence. She feels that symptoms are little bit better since she was diagnosed with low vitamin B12 and low vitamin D and has started treatment for these in the form of injections with B12 and high dose vitamin D once a week. She has had instances where she fell asleep at work at [employer]. This was not late afternoon but rather in the late morning hours which bothered her. She has an Epworth sleepiness score currently of 17 out of 24, fatigue score is 33 out of 63 today. She denies morning headaches. Sometimes she wakes herself up from her own snoring. She lives alone. She has a 60 year old son who sometimes stays over and sleeps on the couch. She says she has a lot of stress from time to time particularly with regards to her son. She has a bedtime of 11 PM and falls asleep very quickly. She has a rise time of 5:45 AM and does not always wake up rested, typically marginally rested. She has nocturia typically 3 times per night and typically at 3 AM, 4 AM and 5 AM. She drinks one cup of  coffee in the mornings. She drinks alcohol very rarely, perhaps once a year or so. She is a nonsmoker.  Her Past Medical History Is Significant For: Past Medical History  Diagnosis Date  . Asthma   . Hypertension   . Hyperlipidemia   . Diabetes mellitus without complication   . Obesity   . Sleep difficulties     Her Past Surgical History Is Significant For: Past Surgical History  Procedure Laterality Date  . Knee arthroscopy      Her Family History Is Significant For: Family History  Problem Relation Age of Onset  . High blood pressure Mother   . Diabetes Mother   . Kidney disease Mother   . Heart attack Father   . High blood pressure Father     Her Social History Is Significant For: History   Social History  . Marital Status: Single    Spouse Name: N/A  . Number of Children: 1  . Years of Education: college   Social History Main Topics  . Smoking status: Never Smoker   . Smokeless tobacco: Never Used  . Alcohol Use: No  . Drug Use: No  . Sexual Activity: Not on file   Other Topics Concern  . None   Social History Narrative   Patient lives at home alone. Patient is single.   Patient works part time.   Education BA. College.   Right handed.   Caffeine two cups of  coffee daily.    Her Allergies Are:  Allergies  Allergen Reactions  . Statins Other (See Comments)    Leg pain  :   Her Current Medications Are:  Outpatient Encounter Prescriptions as of 07/06/2014  Medication Sig  . albuterol (PROVENTIL HFA;VENTOLIN HFA) 108 (90 BASE) MCG/ACT inhaler Inhale 2 puffs into the lungs 2 (two) times daily as needed (asthma attacks).   . beclomethasone (QVAR) 80 MCG/ACT inhaler Inhale 1 puff into the lungs 2 (two) times daily.  . Canagliflozin (INVOKANA) 300 MG TABS Take 300 mg by mouth daily.  Marland Kitchen glucose blood test strip   . lisinopril-hydrochlorothiazide (PRINZIDE,ZESTORETIC) 20-12.5 MG per tablet Take 1 tablet by mouth daily.  . metFORMIN (GLUCOPHAGE) 500 MG  tablet Take 500 mg by mouth 2 (two) times daily with a meal.  . [DISCONTINUED] losartan (COZAAR) 25 MG tablet Take 25 mg by mouth daily.  . [DISCONTINUED] metFORMIN (GLUCOPHAGE) 500 MG tablet    No facility-administered encounter medications on file as of 07/06/2014.  :  Review of Systems:  Out of a complete 14 point review of systems, all are reviewed and negative with the exception of these symptoms as listed below:   Review of Systems  Psychiatric/Behavioral:       Sleepiness Snoring Depression  Anxiety    Objective:  Neurologic Exam  Physical Exam Physical Examination:   Filed Vitals:   07/06/14 0928  BP: 142/82  Pulse: 67   General Examination: The patient is a very pleasant 59 y.o. female in no acute distress. She appears well-developed and well-nourished and well groomed.   HEENT: Normocephalic, atraumatic, pupils are equal, round and reactive to light and accommodation. Funduscopic exam is normal with sharp disc margins noted. Extraocular tracking is good without limitation to gaze excursion or nystagmus noted. Normal smooth pursuit is noted. Hearing is grossly intact. Tympanic membranes are clear bilaterally. Face is symmetric with normal facial animation and normal facial sensation. Speech is clear with no dysarthria noted. There is no hypophonia. There is no lip, neck/head, jaw or voice tremor. Neck is supple with full range of passive and active motion. There are no carotid bruits on auscultation. Oropharynx exam reveals: mild mouth dryness, adequate dental hygiene and moderate airway crowding, due to redundant soft palate, and tonsils in place. Mallampati is class III. Tongue protrudes centrally and palate elevates symmetrically. Tonsils are 1+ in size/absent. Neck size is 14.25 inches. She has a mild overbite.   Chest: Clear to auscultation without wheezing, rhonchi or crackles noted.  Heart: S1+S2+0, regular and normal without murmurs, rubs or gallops noted.    Abdomen: Soft, non-tender and non-distended with normal bowel sounds appreciated on auscultation.  Extremities: There is no pitting edema in the distal lower extremities bilaterally. Pedal pulses are intact.  Skin: Warm and dry without trophic changes noted. There are no varicose veins.  Musculoskeletal: exam reveals no obvious joint deformities, tenderness or joint swelling or erythema.   Neurologically:  Mental status: The patient is awake, alert and oriented in all 4 spheres. Her immediate and remote memory, attention, language skills and fund of knowledge are appropriate. There is no evidence of aphasia, agnosia, apraxia or anomia. Speech is clear with normal prosody and enunciation. Thought process is linear. Mood is normal and affect is normal.  Cranial nerves II - XII are as described above under HEENT exam. In addition: shoulder shrug is normal with equal shoulder height noted. Motor exam: Normal bulk, strength and tone is noted. There is no drift,  tremor or rebound. Romberg is negative. Reflexes are 2+ throughout. Babinski: Toes are flexor bilaterally. Fine motor skills and coordination: intact with normal finger taps, normal hand movements, normal rapid alternating patting, normal foot taps and normal foot agility.  Cerebellar testing: No dysmetria or intention tremor on finger to nose testing. Heel to shin is unremarkable bilaterally. There is no truncal or gait ataxia.  Sensory exam: intact to light touch, pinprick, vibration, temperature sense in the upper and lower extremities.  Gait, station and balance: She stands easily. No veering to one side is noted. No leaning to one side is noted. Posture is age-appropriate and stance is narrow based. Gait shows normal stride length and normal pace. No problems turning are noted. She turns en bloc. Tandem walk is unremarkable.   Assessment and Plan:   In summary, Dorothy Torres is a very pleasant 59 y.o.-year old female asthma,  hypertension, hyperlipidemia, diabetes, obesity, who reports snoring, nonrestorative sleep, excessive daytime somnolence and frequent nocturia. Her history and physical exam are concerning for underlying obstructive sleep apnea (OSA). I had a long chat with the patient about my findings and the diagnosis of OSA, its prognosis and treatment options. We talked about medical treatments, surgical interventions and non-pharmacological approaches. I explained in particular the risks and ramifications of untreated moderate to severe OSA, especially with respect to developing cardiovascular disease down the Road, including congestive heart failure, difficult to treat hypertension, cardiac arrhythmias, or stroke. Even type 2 diabetes has, in part, been linked to untreated OSA. Symptoms of untreated OSA include daytime sleepiness, memory problems, mood irritability and mood disorder such as depression and anxiety, lack of energy, as well as recurrent headaches, especially morning headaches. We talked about trying to maintain a healthy lifestyle in general, as well as the importance of weight control. I encouraged the patient to eat healthy, exercise daily and keep well hydrated, to keep a scheduled bedtime and wake time routine, to not skip any meals and eat healthy snacks in between meals. I advised the patient not to drive when feeling sleepy. I recommended the following at this time: sleep study with potential positive airway pressure titration. (We will score hypopneas at 4% and split the sleep study into diagnostic and treatment portion, if the estimated. 2 hour AHI is >20/h).   I explained the sleep test procedure to the patient and also outlined possible surgical and non-surgical treatment options of OSA, including the use of a custom-made dental device (which would require a referral to a specialist dentist or oral surgeon), upper airway surgical options, such as pillar implants, radiofrequency surgery, tongue  base surgery, and UPPP (which would involve a referral to an ENT surgeon). Rarely, jaw surgery such as mandibular advancement may be considered.  I also explained the CPAP treatment option to the patient, who indicated that she would be reluctant to to try CPAP, but eventually agreed to be open to give it a try if needed. I explained the importance of being compliant with PAP treatment, not only for insurance purposes but primarily to improve Her symptoms, and for the patient's long term health benefit, including to reduce Her cardiovascular risks. I answered all her questions today and the patient was in agreement. I would like to see her back after the sleep study is completed and encouraged her to call with any interim questions, concerns, problems or updates.   Thank you very much for allowing me to participate in the care of this nice patient. If I can  be of any further assistance to you please do not hesitate to call me at [phone].  Sincerely,   Star Age, MD, PhD

## 2014-07-25 ENCOUNTER — Encounter: Payer: Self-pay | Admitting: Internal Medicine

## 2014-07-31 ENCOUNTER — Telehealth: Payer: Self-pay | Admitting: Neurology

## 2014-07-31 NOTE — Telephone Encounter (Signed)
[facility] me for a Pulmonary Function Test for this patient before authorization for a sleep study can be approved.  She would like it faxed to [phone]

## 2014-07-31 NOTE — Telephone Encounter (Signed)
I called PCP, they have no record of her completing one.  I left a message for patient to call back. I would like to find out if patient has ever had this done before and if so, where?

## 2014-08-01 ENCOUNTER — Telehealth: Payer: Self-pay | Admitting: Neurology

## 2014-08-01 DIAGNOSIS — R351 Nocturia: Secondary | ICD-10-CM

## 2014-08-01 DIAGNOSIS — R0683 Snoring: Secondary | ICD-10-CM

## 2014-08-01 DIAGNOSIS — G4719 Other hypersomnia: Secondary | ICD-10-CM

## 2014-08-01 DIAGNOSIS — E669 Obesity, unspecified: Secondary | ICD-10-CM

## 2014-08-01 NOTE — Telephone Encounter (Signed)
Insurance will cover home sleep test, therefore PFT is not needed.

## 2014-08-01 NOTE — Telephone Encounter (Signed)
Ok to order home sleep test to avoid peer to peer?

## 2014-08-01 NOTE — Telephone Encounter (Signed)
Ins. Denied [facility] need order for HST

## 2014-08-22 NOTE — Telephone Encounter (Signed)
This patient's insurance denied an attended sleep study. I will order home sleep test.  

## 2014-09-19 ENCOUNTER — Encounter (INDEPENDENT_AMBULATORY_CARE_PROVIDER_SITE_OTHER): Payer: Self-pay | Admitting: Neurology

## 2014-09-19 DIAGNOSIS — Z0289 Encounter for other administrative examinations: Secondary | ICD-10-CM

## 2014-09-30 ENCOUNTER — Encounter (INDEPENDENT_AMBULATORY_CARE_PROVIDER_SITE_OTHER): Payer: 59 | Admitting: Neurology

## 2014-09-30 DIAGNOSIS — G4733 Obstructive sleep apnea (adult) (pediatric): Secondary | ICD-10-CM | POA: Diagnosis not present

## 2014-10-03 ENCOUNTER — Telehealth: Payer: Self-pay | Admitting: Neurology

## 2014-10-03 NOTE — Telephone Encounter (Signed)
I spoke to patient and she is aware of results and recommendation. I will send copy to patient and PCP.

## 2014-10-03 NOTE — Telephone Encounter (Signed)
Patient referred by Dr. Timothy Lasso, seen by me on 07/06/14, HST on 09/30/14:  Please call and notify the patient that the recent home sleep test did not show any significant obstructive sleep apnea. Patient can follow up with the referring provider or we can make a FU appointment to discuss, as she prefers. A copy of the report will be sent to the patient, the PCP and referring MD, if other than PCP.  Once you have spoken to patient, you can close this encounter.   Thanks,  Huston Foley, MD, PhD [facility])

## 2014-10-26 ENCOUNTER — Encounter: Payer: Self-pay | Admitting: Gastroenterology

## 2014-12-17 ENCOUNTER — Ambulatory Visit (AMBULATORY_SURGERY_CENTER): Payer: Self-pay | Admitting: *Deleted

## 2014-12-17 VITALS — Ht 60.0 in | Wt 207.8 lb

## 2014-12-17 DIAGNOSIS — Z860100 Personal history of colon polyps, unspecified: Secondary | ICD-10-CM

## 2014-12-17 NOTE — Progress Notes (Signed)
No allergies to eggs or soy No past problems with anesthesia No home oxygen No diet/weight loss meds  Has email and internet; refused emmi 

## 2014-12-31 ENCOUNTER — Encounter: Payer: 59 | Admitting: Gastroenterology

## 2015-04-20 DIAGNOSIS — Z1231 Encounter for screening mammogram for malignant neoplasm of breast: Secondary | ICD-10-CM | POA: Diagnosis not present

## 2015-04-29 ENCOUNTER — Encounter: Payer: Self-pay | Admitting: Gastroenterology

## 2015-05-13 ENCOUNTER — Encounter (HOSPITAL_COMMUNITY): Payer: Self-pay

## 2015-05-13 ENCOUNTER — Emergency Department (HOSPITAL_COMMUNITY)
Admission: EM | Admit: 2015-05-13 | Discharge: 2015-05-13 | Disposition: A | Discharge status: Home / Self Care | Payer: BLUE CROSS/BLUE SHIELD | Attending: Emergency Medicine | Admitting: Emergency Medicine

## 2015-05-13 ENCOUNTER — Emergency Department (HOSPITAL_COMMUNITY): Payer: BLUE CROSS/BLUE SHIELD

## 2015-05-13 DIAGNOSIS — Y9289 Other specified places as the place of occurrence of the external cause: Secondary | ICD-10-CM | POA: Diagnosis not present

## 2015-05-13 DIAGNOSIS — Y9389 Activity, other specified: Secondary | ICD-10-CM | POA: Insufficient documentation

## 2015-05-13 DIAGNOSIS — X501XXA Overexertion from prolonged static or awkward postures, initial encounter: Secondary | ICD-10-CM | POA: Diagnosis not present

## 2015-05-13 DIAGNOSIS — M7989 Other specified soft tissue disorders: Secondary | ICD-10-CM | POA: Diagnosis not present

## 2015-05-13 DIAGNOSIS — Z79899 Other long term (current) drug therapy: Secondary | ICD-10-CM | POA: Insufficient documentation

## 2015-05-13 DIAGNOSIS — J45909 Unspecified asthma, uncomplicated: Secondary | ICD-10-CM | POA: Insufficient documentation

## 2015-05-13 DIAGNOSIS — S99912A Unspecified injury of left ankle, initial encounter: Secondary | ICD-10-CM | POA: Diagnosis not present

## 2015-05-13 DIAGNOSIS — I10 Essential (primary) hypertension: Secondary | ICD-10-CM | POA: Insufficient documentation

## 2015-05-13 DIAGNOSIS — E669 Obesity, unspecified: Secondary | ICD-10-CM | POA: Insufficient documentation

## 2015-05-13 DIAGNOSIS — E119 Type 2 diabetes mellitus without complications: Secondary | ICD-10-CM | POA: Diagnosis not present

## 2015-05-13 DIAGNOSIS — M25572 Pain in left ankle and joints of left foot: Secondary | ICD-10-CM | POA: Diagnosis not present

## 2015-05-13 DIAGNOSIS — Y998 Other external cause status: Secondary | ICD-10-CM | POA: Diagnosis not present

## 2015-05-13 DIAGNOSIS — S93402A Sprain of unspecified ligament of left ankle, initial encounter: Secondary | ICD-10-CM | POA: Diagnosis not present

## 2015-05-13 DIAGNOSIS — Z7984 Long term (current) use of oral hypoglycemic drugs: Secondary | ICD-10-CM | POA: Diagnosis not present

## 2015-05-13 MED ORDER — HYDROCODONE-ACETAMINOPHEN 5-325 MG PO TABS: 1.0000 | ORAL_TABLET | Freq: Four times a day (QID) | ORAL | Status: DC | PRN

## 2015-05-13 NOTE — Discharge Instructions (Signed)

## 2015-05-13 NOTE — ED Notes (Signed)
Pt from home with ankle pain that started late Friday evening and twisted her ankle. Pt states the pain is worse.

## 2015-05-13 NOTE — Progress Notes (Signed)
Orthopedic Tech Progress Note Patient Details:  Ronnell GuadalajaraLinda M Cashion 12-06-1955 [id number]  Ortho Devices Type of Ortho Device: ASO, Crutches Ortho Device/Splint Location: lle Ortho Device/Splint Interventions: Application   Lynnett Langlinais 05/13/2015, 8:07 AM

## 2015-05-13 NOTE — ED Provider Notes (Signed)
CSN: [id number]     Arrival date & time 05/13/15  0607 History   First MD Initiated Contact with Patient 05/13/15 0617     Chief Complaint  Patient presents with  . Ankle Pain    left     (Consider location/radiation/quality/duration/timing/severity/associated sxs/prior Treatment) HPI Comments: Patient presents to the ED with a chief complaint of left ankle pain.  She states that she twisted her ankle on Friday night (3 days ago).  She has had persistent pain since then.  She has tried using an ACE wrap, tylenol and elevation.  She is able to ambulate on the ankle, but has to limp.  She states that the pain is more located on the inside of the ankle.  The symptoms are worsened with palpation and movement.  The history is provided by the patient. No language interpreter was used.    Past Medical History  Diagnosis Date  . Asthma   . Hypertension   . Hyperlipidemia   . Diabetes mellitus without complication (HCC)   . Obesity   . Sleep difficulties    Past Surgical History  Procedure Laterality Date  . Knee arthroscopy    . Trauma lower abdomen     Family History  Problem Relation Age of Onset  . High blood pressure Mother   . Diabetes Mother   . Kidney disease Mother   . Heart attack Father   . High blood pressure Father   . Colon cancer Neg Hx    Social History  Substance Use Topics  . Smoking status: Never Smoker   . Smokeless tobacco: Never Used  . Alcohol Use: No   OB History    No data available     Review of Systems  Constitutional: Negative for fever and chills.  Respiratory: Negative for shortness of breath.   Cardiovascular: Negative for chest pain.  Gastrointestinal: Negative for nausea, vomiting, diarrhea and constipation.  Genitourinary: Negative for dysuria.  Musculoskeletal: Positive for joint swelling and arthralgias.  All other systems reviewed and are negative.     Allergies  Statins  Home Medications   Prior to Admission medications    Medication Sig Start Date End Date Taking? Authorizing Provider  glucose blood test strip  06/15/14   Historical Provider, MD  lisinopril-hydrochlorothiazide (PRINZIDE,ZESTORETIC) 20-12.5 MG per tablet Take 1 tablet by mouth daily.    Historical Provider, MD  metFORMIN (GLUCOPHAGE) 500 MG tablet Take 500 mg by mouth 2 (two) times daily with a meal.    Historical Provider, MD   BP 171/89 mmHg  Pulse 66  Temp(Src) 98.1 F (36.7 C) (Oral)  Resp 16  Ht 5' (1.524 m)  Wt 92.987 kg  BMI 40.04 kg/m2  SpO2 100% Physical Exam Physical Exam  Constitutional: Pt appears well-developed and well-nourished. No distress.  HENT:  Head: Normocephalic and atraumatic.  Eyes: Conjunctivae are normal.  Neck: Normal range of motion.  Cardiovascular: Normal rate, regular rhythm and intact distal pulses.   Capillary refill < 3 sec  Pulmonary/Chest: Effort normal and breath sounds normal.  Musculoskeletal: Pt exhibits tenderness over the medial malleolus and anterior ankle with moderate swelling. Pt exhibits no edema.  ROM: 4/5  Neurological: Pt  is alert. Coordination normal.  Sensation 5/5 Strength 4/5  Skin: Skin is warm and dry. Pt is not diaphoretic.  No tenting of the skin  Psychiatric: Pt has a normal mood and affect.  Nursing note and vitals reviewed.  ED Course  Procedures (including critical care time)  Imaging Review Dg Ankle Complete Left  05/13/2015  CLINICAL DATA:  Twisted ankle on Friday with persistent pain and lateral swelling. Initial encounter. EXAM: LEFT ANKLE COMPLETE - 3+ VIEW COMPARISON:  None. FINDINGS: There is no evidence of fracture, dislocation, or joint effusion. Remote avulsion injury or enthesophyte at the medial malleolus. Small Achilles enthesophyte. IMPRESSION: No acute finding. Electronically Signed   By: Marnee Spring M.D.   On: 05/13/2015 06:56   I have personally reviewed and evaluated these images and lab results as part of my medical  decision-making.    MDM   Final diagnoses:  Ankle sprain, left, initial encounter    Patient with left ankle pain and swelling.  Twisted ankle on Friday night.  Will check plain films.  Suspect sprain as the patient is able to ambulate.  Plain films are negative.  Splint, crutches, ortho follow-up.    Roxy Horseman, PA-C 05/13/15 1519  Dione Booze, MD 05/29/15 [phone]

## 2015-06-06 ENCOUNTER — Encounter: Payer: Self-pay | Admitting: Gastroenterology

## 2015-06-06 ENCOUNTER — Ambulatory Visit (AMBULATORY_SURGERY_CENTER): Payer: Self-pay | Admitting: *Deleted

## 2015-06-06 VITALS — Ht 60.0 in | Wt 205.0 lb

## 2015-06-06 DIAGNOSIS — Z86010 Personal history of colonic polyps: Secondary | ICD-10-CM

## 2015-06-06 MED ORDER — NA SULFATE-K SULFATE-MG SULF 17.5-3.13-1.6 GM/177ML PO SOLN: 1.0000 | Freq: Once | ORAL | Status: DC

## 2015-06-06 NOTE — Progress Notes (Signed)
No egg or soy allergy known to patient  No issues with past sedation with any surgeries  or procedures, no intubation problems  No diet pills per patient No home 02 use per patient  No blood thinners per patient  Pt states  issues with constipation recnetly but since changed diet is improving- stools now are soft   Pt asked to RS her appt to a day in may- RS to 5-25 Thursday with Nandigam- gave suprep sample as pt has had some issues with constipation recently which seems to ave corrected itself as she is now having soft stools and her [employer] select willnot cover the prep-Medication Samples have been provided to the patient.  Drug name: suprep    Qty: 1  LOT: [id number]  Exp.Date: 3-19  The patient has been instructed regarding the correct time, dose, and frequency of taking this medication, including desired effects and most common side effects.   Dorothy Torres, Dorothy Torres 8:54 AM 06/06/2015

## 2015-06-11 ENCOUNTER — Encounter: Payer: Self-pay | Admitting: Gastroenterology

## 2015-06-11 ENCOUNTER — Ambulatory Visit (AMBULATORY_SURGERY_CENTER): Payer: BLUE CROSS/BLUE SHIELD | Admitting: Gastroenterology

## 2015-06-11 VITALS — BP 151/85 | HR 54 | Temp 98.6°F | Resp 12 | Ht 60.0 in | Wt 205.0 lb

## 2015-06-11 DIAGNOSIS — Z86010 Personal history of colonic polyps: Secondary | ICD-10-CM | POA: Diagnosis not present

## 2015-06-11 LAB — GLUCOSE, CAPILLARY
GLUCOSE-CAPILLARY: 114 mg/dL — AB (ref 65–99)
GLUCOSE-CAPILLARY: 106 mg/dL — AB (ref 65–99)

## 2015-06-11 MED ORDER — SODIUM CHLORIDE 0.9 % IV SOLN: 500.0000 mL | INTRAVENOUS | Status: DC

## 2015-06-11 NOTE — Op Note (Signed)
[facility] Patient Name: Dorothy Torres Procedure Date: 06/11/2015 7:50 AM MRN: [id number] Endoscopist: Napoleon Form , MD Age: 60 Referring MD:  Date of Birth: [birth year] Gender: Female Procedure:                Colonoscopy Indications:              High risk colon cancer surveillance: Personal                            history of colonic polyps, Surveillance: Personal                            history of adenomatous polyps on last colonoscopy >                            5 years ago Medicines:                Monitored Anesthesia Care Procedure:                Pre-Anesthesia Assessment:                           - Prior to the procedure, a History and Physical                            was performed, and patient medications and                            allergies were reviewed. The patient's tolerance of                            previous anesthesia was also reviewed. The risks                            and benefits of the procedure and the sedation                            options and risks were discussed with the patient.                            All questions were answered, and informed consent                            was obtained. Prior Anticoagulants: The patient has                            taken no previous anticoagulant or antiplatelet                            agents. ASA Grade Assessment: II - A patient with                            mild systemic disease. After reviewing the risks  and benefits, the patient was deemed in                            satisfactory condition to undergo the procedure.                           After obtaining informed consent, the colonoscope                            was passed under direct vision. Throughout the                            procedure, the patient's blood pressure, pulse, and                            oxygen saturations were monitored continuously. The             Model CF-HQ190L [id number](SN#[id number]) scope was introduced                            through the anus and advanced to the the cecum,                            identified by appendiceal orifice and ileocecal                            valve. The colonoscopy was performed without                            difficulty. The patient tolerated the procedure                            well. The quality of the bowel preparation was                            good. The ileocecal valve, appendiceal orifice, and                            rectum were photographed. Scope In: 8:17:06 AM Scope Out: 8:35:53 AM Scope Withdrawal Time: 0 hours 12 minutes 3 seconds  Total Procedure Duration: 0 hours 18 minutes 47 seconds  Findings:                 The perianal and digital rectal examinations were                            normal.                           Non-bleeding internal hemorrhoids were found during                            retroflexion. The hemorrhoids were small.                           The exam was otherwise without  abnormality. Complications:            No immediate complications. Estimated Blood Loss:     Estimated blood loss: none. Impression:               - Non-bleeding internal hemorrhoids.                           - The examination was otherwise normal.                           - No specimens collected. Recommendation:           - Patient has a contact number available for                            emergencies. The signs and symptoms of potential                            delayed complications were discussed with the                            patient. Return to normal activities tomorrow.                            Written discharge instructions were provided to the                            patient.                           - Resume previous diet.                           - Continue present medications.                           - Await pathology results.                            - Repeat colonoscopy in 5 years for surveillance                            based on personal history of previous adenomatous                            polyps.                           - Return to GI clinic PRN. Napoleon Form, MD 06/11/2015 8:41:31 AM This report has been signed electronically.

## 2015-06-11 NOTE — Progress Notes (Signed)
  [facility] Anesthesia Post-op Note  Patient: Dorothy Torres  Procedure(s) Performed: colonoscopy  Patient Location: [facility] - Recovery Area  Anesthesia Type: Deep Sedation/Propofol  Level of Consciousness: awake, oriented and patient cooperative  Airway and Oxygen Therapy: Patient Spontanous Breathing  Post-op Pain: none  Post-op Assessment:  Post-op Vital signs reviewed, Patient's Cardiovascular Status Stable, Respiratory Function Stable, Patent Airway, No signs of Nausea or vomiting and Pain level controlled  Post-op Vital Signs: Reviewed and stable  Complications: No apparent anesthesia complications  Sholonda Jobst E 8:42 AM

## 2015-06-11 NOTE — Patient Instructions (Signed)
YOU HAD AN ENDOSCOPIC PROCEDURE TODAY AT THE [facility]:   Refer to the procedure report that was given to you for any specific questions about what was found during the examination.  If the procedure report does not answer your questions, please call your gastroenterologist to clarify.  If you requested that your care partner not be given the details of your procedure findings, then the procedure report has been included in a sealed envelope for you to review at your convenience later.  YOU SHOULD EXPECT: Some feelings of bloating in the abdomen. Passage of more gas than usual.  Walking can help get rid of the air that was put into your GI tract during the procedure and reduce the bloating. If you had a lower endoscopy (such as a colonoscopy or flexible sigmoidoscopy) you may notice spotting of blood in your stool or on the toilet paper. If you underwent a bowel prep for your procedure, you may not have a normal bowel movement for a few days.  Please Note:  You might notice some irritation and congestion in your nose or some drainage.  This is from the oxygen used during your procedure.  There is no need for concern and it should clear up in a day or so.  SYMPTOMS TO REPORT IMMEDIATELY:   Following lower endoscopy (colonoscopy or flexible sigmoidoscopy):  Excessive amounts of blood in the stool  Significant tenderness or worsening of abdominal pains  Swelling of the abdomen that is new, acute  Fever of 100F or higher    For urgent or emergent issues, a gastroenterologist can be reached at any hour by calling [phone].   DIET: Your first meal following the procedure should be a small meal and then it is ok to progress to your normal diet. Heavy or fried foods are harder to digest and may make you feel nauseous or bloated.  Likewise, meals heavy in dairy and vegetables can increase bloating.  Drink plenty of fluids but you should avoid alcoholic beverages for 24  hours.  ACTIVITY:  You should plan to take it easy for the rest of today and you should NOT DRIVE or use heavy machinery until tomorrow (because of the sedation medicines used during the test).    FOLLOW UP: Our staff will call the number listed on your records the next business day following your procedure to check on you and address any questions or concerns that you may have regarding the information given to you following your procedure. If we do not reach you, we will leave a message.  However, if you are feeling well and you are not experiencing any problems, there is no need to return our call.  We will assume that you have returned to your regular daily activities without incident.  If any biopsies were taken you will be contacted by phone or by letter within the next 1-3 weeks.  Please call us at [phone] if you have not heard about the biopsies in 3 weeks.    SIGNATURES/CONFIDENTIALITY: You and/or your care partner have signed paperwork which will be entered into your electronic medical record.  These signatures attest to the fact that that the information above on your After Visit Summary has been reviewed and is understood.  Full responsibility of the confidentiality of this discharge information lies with you and/or your care-partner.   Information on hemorrhoids given to you today 

## 2015-06-12 ENCOUNTER — Telehealth: Payer: Self-pay | Admitting: *Deleted

## 2015-06-12 NOTE — Telephone Encounter (Signed)
  Follow up Call-  Call back number 06/11/2015  Post procedure Call Back phone  # [phone]  Permission to leave phone message Yes     Patient questions:  Do you have a fever, pain , or abdominal swelling? No. Pain Score  0 *  Have you tolerated food without any problems? Yes.    Have you been able to return to your normal activities? Yes.    Do you have any questions about your discharge instructions: Diet   No. Medications  No. Follow up visit  No.  Do you have questions or concerns about your Care? No.  Actions: * If pain score is 4 or above: No action needed, pain <4.

## 2015-06-13 ENCOUNTER — Encounter: Payer: BLUE CROSS/BLUE SHIELD | Admitting: Gastroenterology

## 2015-06-25 ENCOUNTER — Encounter: Payer: Self-pay | Admitting: Gastroenterology

## 2015-08-01 DIAGNOSIS — E559 Vitamin D deficiency, unspecified: Secondary | ICD-10-CM | POA: Diagnosis not present

## 2015-08-01 DIAGNOSIS — I10 Essential (primary) hypertension: Secondary | ICD-10-CM | POA: Diagnosis not present

## 2015-08-01 DIAGNOSIS — E538 Deficiency of other specified B group vitamins: Secondary | ICD-10-CM | POA: Diagnosis not present

## 2015-08-01 DIAGNOSIS — E119 Type 2 diabetes mellitus without complications: Secondary | ICD-10-CM | POA: Diagnosis not present

## 2016-01-24 ENCOUNTER — Encounter: Payer: Self-pay | Admitting: Podiatry

## 2016-01-24 ENCOUNTER — Ambulatory Visit (INDEPENDENT_AMBULATORY_CARE_PROVIDER_SITE_OTHER): Payer: BLUE CROSS/BLUE SHIELD

## 2016-01-24 ENCOUNTER — Ambulatory Visit: Payer: BLUE CROSS/BLUE SHIELD

## 2016-01-24 ENCOUNTER — Ambulatory Visit (INDEPENDENT_AMBULATORY_CARE_PROVIDER_SITE_OTHER): Payer: BLUE CROSS/BLUE SHIELD | Admitting: Podiatry

## 2016-01-24 VITALS — BP 196/106 | HR 59 | Resp 16 | Ht 60.0 in | Wt 206.0 lb

## 2016-01-24 DIAGNOSIS — M7662 Achilles tendinitis, left leg: Secondary | ICD-10-CM | POA: Diagnosis not present

## 2016-01-24 DIAGNOSIS — R52 Pain, unspecified: Secondary | ICD-10-CM | POA: Diagnosis not present

## 2016-01-24 DIAGNOSIS — M109 Gout, unspecified: Secondary | ICD-10-CM

## 2016-01-24 DIAGNOSIS — M79673 Pain in unspecified foot: Secondary | ICD-10-CM | POA: Diagnosis not present

## 2016-01-24 MED ORDER — MELOXICAM 15 MG PO TABS: 15.0000 mg | ORAL_TABLET | Freq: Every day | ORAL | 0 refills | Status: DC

## 2016-01-24 NOTE — Progress Notes (Signed)
   Subjective:    Patient ID: Dorothy GuadalajaraLinda M Saefong, female    DOB: 04/27/1955, 61 y.o.   MRN: [id number]  HPI this patient presents the office with chief complaint of pain noted in both feet for the last 1-1/2 weeks. She says she had an acute flareup in the big toe joint of the right foot, which was diagnosed as gout by Dr. Timothy Lassousso he treated her with cortisone and since she had additional pain. He refill the prescription yesterday. She says that her big toe joint is improving with the medication. She also gives a history of having pain in the back of her left heel which she believes started last summer when she injured her left foot and went to the emergency room. She was given a brace to wear on her foot. Since that time she has had episodes of pain, severe at times at the back of her left heel. She says that the pain was very bad yesterday but has improved 50% taking prednisone. She presents the office today for further evaluation and treatment of this condition    Review of Systems  All other systems reviewed and are negative.      Objective:   Physical Exam GENERAL APPEARANCE: Alert, conversant. Appropriately groomed. No acute distress.  VASCULAR: Pedal pulses are  palpable at  [facility].  Capillary refill time is immediate to all digits,  Normal temperature gradient.   NEUROLOGIC: sensation is normal to 5.07 monofilament at 5/5 sites bilateral.  Light touch is intact bilateral, Muscle strength normal.  MUSCULOSKELETAL: acceptable muscle strength, tone and stability bilateral.  Intrinsic muscluature intact bilateral.  Rectus appearance of foot and digits noted bilateral. Palpable pain at the insertion achilles tendon left foot.  Pain on RON left foot at site of achilles insertion.  There is swelling and pain  1st MPJ right foot.  DERMATOLOGIC: skin color, texture, and turgor are within normal limits.  No preulcerative lesions or ulcers  are seen, no interdigital maceration noted.  No open  lesions present.  Digital nails are asymptomatic. No drainage noted.         Assessment & Plan:  S/P gout right foot   Insertional achilles tendinitis left foot.   IE  Xrays reveal long first metatarsal  B/L  Bony osteophyte noted at the insertion achilles tendon  B/L.  Patient was told to continue taking the prednisone that she received from Dr. Timothy Lassousso until the medicine is completed. I will then prescribed her Mobic to take one a day as needed for pain. We discussed treatment with power step insoles. She is to return to the office in 3 weeks for further evaluation and treatment. We did discuss surgical correction of her painful big toe joint, right foot and I told her I could give her a consult to one of the surgeons in the practice.   Helane GuntherGregory Mayer DPM

## 2016-01-24 NOTE — Addendum Note (Signed)
Addended by: Helane GuntherMAYER, Retia Cordle on: 01/24/2016 11:31 AM   Modules accepted: Orders

## 2016-02-18 DIAGNOSIS — M109 Gout, unspecified: Secondary | ICD-10-CM | POA: Diagnosis not present

## 2016-02-18 DIAGNOSIS — Z23 Encounter for immunization: Secondary | ICD-10-CM | POA: Diagnosis not present

## 2016-02-21 ENCOUNTER — Telehealth: Payer: Self-pay | Admitting: *Deleted

## 2016-02-21 NOTE — Telephone Encounter (Addendum)
Pt states she is in extreme pain with the gout in her right toe and wanted a medication by Dr. Stacie AcresMayer. I spoke with pt and she states Dr. Timothy Lassousso put her on Allopurinol and she wanted Dr. Stacie AcresMayer to know. Routed message to Dr. Stacie AcresMayer. 02/26/2016-Pt asked status of Dr. Ferd Hibbsusso's paper work for her diabetic shoes. 02/27/2016-DrStacie Acres. Mayer states he has not examined pt for the prospect of diabetic shoes, instructed her to make an appt with a surgeon in our practice.

## 2016-02-27 ENCOUNTER — Other Ambulatory Visit: Payer: Self-pay | Admitting: Podiatry

## 2016-04-01 ENCOUNTER — Other Ambulatory Visit: Payer: Self-pay

## 2016-04-01 ENCOUNTER — Emergency Department (HOSPITAL_COMMUNITY)
Admission: EM | Admit: 2016-04-01 | Discharge: 2016-04-01 | Disposition: A | Discharge status: Home / Self Care | Payer: BLUE CROSS/BLUE SHIELD | Attending: Emergency Medicine | Admitting: Emergency Medicine

## 2016-04-01 ENCOUNTER — Encounter (HOSPITAL_COMMUNITY): Payer: Self-pay

## 2016-04-01 DIAGNOSIS — E119 Type 2 diabetes mellitus without complications: Secondary | ICD-10-CM | POA: Diagnosis not present

## 2016-04-01 DIAGNOSIS — R030 Elevated blood-pressure reading, without diagnosis of hypertension: Secondary | ICD-10-CM | POA: Diagnosis not present

## 2016-04-01 DIAGNOSIS — I10 Essential (primary) hypertension: Secondary | ICD-10-CM | POA: Diagnosis not present

## 2016-04-01 DIAGNOSIS — J45909 Unspecified asthma, uncomplicated: Secondary | ICD-10-CM | POA: Diagnosis not present

## 2016-04-01 DIAGNOSIS — R42 Dizziness and giddiness: Secondary | ICD-10-CM | POA: Diagnosis not present

## 2016-04-01 DIAGNOSIS — Z7984 Long term (current) use of oral hypoglycemic drugs: Secondary | ICD-10-CM | POA: Insufficient documentation

## 2016-04-01 DIAGNOSIS — M79601 Pain in right arm: Secondary | ICD-10-CM | POA: Diagnosis not present

## 2016-04-01 LAB — I-STAT CHEM 8, ED
BUN: 16 mg/dL (ref 6–20)
CHLORIDE: 106 mmol/L (ref 101–111)
Calcium, Ion: 1.1 mmol/L — ABNORMAL LOW (ref 1.15–1.40)
Creatinine, Ser: 1 mg/dL (ref 0.44–1.00)
Glucose, Bld: 127 mg/dL — ABNORMAL HIGH (ref 65–99)
HEMATOCRIT: 37 % (ref 36.0–46.0)
Hemoglobin: 12.6 g/dL (ref 12.0–15.0)
POTASSIUM: 4 mmol/L (ref 3.5–5.1)
SODIUM: 144 mmol/L (ref 135–145)
TCO2: 27 mmol/L (ref 0–100)

## 2016-04-01 LAB — CBG MONITORING, ED: Glucose-Capillary: 126 mg/dL — ABNORMAL HIGH (ref 65–99)

## 2016-04-01 MED ORDER — SODIUM CHLORIDE 0.9 % IV BOLUS (SEPSIS)
1000.0000 mL | Freq: Once | INTRAVENOUS | Status: AC
  Administered 2016-04-01: 1000 mL via INTRAVENOUS

## 2016-04-01 MED ORDER — DIAZEPAM 5 MG PO TABS
5.0000 mg | ORAL_TABLET | Freq: Once | ORAL | Status: AC
  Administered 2016-04-01: 5 mg via ORAL
  Filled 2016-04-01: qty 1

## 2016-04-01 MED ORDER — ACETAMINOPHEN 500 MG PO TABS
1000.0000 mg | ORAL_TABLET | Freq: Once | ORAL | Status: AC
  Administered 2016-04-01: 1000 mg via ORAL
  Filled 2016-04-01: qty 2

## 2016-04-01 MED ORDER — ONDANSETRON 4 MG PO TBDP: 8.0000 mg | ORAL_TABLET | Freq: Once | ORAL | Status: DC

## 2016-04-01 MED ORDER — MECLIZINE HCL 25 MG PO TABS: 25.0000 mg | ORAL_TABLET | Freq: Three times a day (TID) | ORAL | 0 refills | Status: DC | PRN

## 2016-04-01 NOTE — ED Notes (Signed)
Walked patient down the hall patient did well patient back into the bed resting

## 2016-04-01 NOTE — ED Notes (Signed)
Ambulated to bathroom with minimal assistance.

## 2016-04-01 NOTE — ED Notes (Signed)
Ambulated to bathroom without difficulty. Family at bedside

## 2016-04-01 NOTE — ED Triage Notes (Addendum)
[facility]- pt here for left arm pain, nausea and some blurred vision. Pain began last night around 10pm. She woke up with headache. Went to work had diarrhea and felt nauseous with an episode of dizziness. Pt had 4mg  of zofran. 20g IV RAC. Hypertensive with EMS, last BP 168/74.

## 2016-04-01 NOTE — ED Provider Notes (Signed)
[facility]-EMERGENCY DEPT Provider Note   CSN: [id number] Arrival date & time: 04/01/16  0935     History   Chief Complaint Chief Complaint  Patient presents with  . Headache  . Nausea  . Dizziness    HPI Dorothy Torres is a 61 y.o. female. Patient presents to the emergency department with complaints of lightheadedness and dizziness which began acutely this morning.  She had mild symptoms last night but her symptoms significantly worsen while she is at work.  She felt things spinning in front of her.  She became nauseated.  She had an episode of diarrhea.  No vomiting.  Denies unilateral arm or leg weakness.  She did report some mild shoulder discomfort without injury or trauma.  Is worse with range of motion of her shoulder.  She received 4 mg IV Zofran around with some improvement in her nausea.  She feels like she's been eating and drinking normally.  Symptoms are moderate in severity and currently improving per the patient       Past Medical History:  Diagnosis Date  . Allergy   . Anxiety   . Asthma   . Constipation    hard to pass her stool   . Diabetes mellitus without complication (HCC)   . Hyperlipidemia   . Hypertension   . Obesity   . Sleep difficulties     Patient Active Problem List   Diagnosis Date Noted  . Sleep difficulties   . Slurred speech 07/20/2013    Past Surgical History:  Procedure Laterality Date  . COLONOSCOPY    . KNEE ARTHROSCOPY    . POLYPECTOMY    . trauma lower abdomen      OB History    No data available       Home Medications    Prior to Admission medications   Medication Sig Start Date End Date Taking? Authorizing Provider  lisinopril-hydrochlorothiazide (PRINZIDE,ZESTORETIC) 20-12.5 MG per tablet Take 1 tablet by mouth daily.    Historical Provider, MD  meclizine (ANTIVERT) 25 MG tablet Take 1 tablet (25 mg total) by mouth 3 (three) times daily as needed for dizziness. 04/01/16   Azalia Bilis, MD  meloxicam (MOBIC) 15 MG  tablet TAKE 1 TABLET (15 MG TOTAL) BY MOUTH DAILY. 02/27/16   Helane Gunther, DPM  metFORMIN (GLUCOPHAGE) 500 MG tablet Take 500 mg by mouth 2 (two) times daily with a meal.    Historical Provider, MD  predniSONE (DELTASONE) 20 MG tablet  01/23/16   Historical Provider, MD    Family History Family History  Problem Relation Age of Onset  . High blood pressure Mother   . Diabetes Mother   . Kidney disease Mother   . Colon polyps Mother   . Heart attack Father   . High blood pressure Father   . Colon polyps Other     cousins x multiple - maternal side   . Breast cancer Paternal Grandmother   . Colon cancer Neg Hx   . Rectal cancer Neg Hx   . Stomach cancer Neg Hx     Social History Social History  Substance Use Topics  . Smoking status: Never Smoker  . Smokeless tobacco: Never Used  . Alcohol use No     Allergies   Statins   Review of Systems Review of Systems  All other systems reviewed and are negative.    Physical Exam Updated Vital Signs BP 163/78   Pulse (!) 58   Temp 98.2 F (36.8 C) (  Oral)   Resp 20   SpO2 100%   Physical Exam  Constitutional: She is oriented to person, place, and time. She appears well-developed and well-nourished. No distress.  HENT:  Head: Normocephalic and atraumatic.  Eyes: EOM are normal. Pupils are equal, round, and reactive to light.  Neck: Normal range of motion.  Cardiovascular: Normal rate, regular rhythm and normal heart sounds.   Pulmonary/Chest: Effort normal and breath sounds normal.  Abdominal: Soft. She exhibits no distension. There is no tenderness.  Musculoskeletal: Normal range of motion.  Neurological: She is alert and oriented to person, place, and time.  5/5 strength in major muscle groups of  bilateral upper and lower extremities. Speech normal. No facial asymetry.   Skin: Skin is warm and dry.  Psychiatric: She has a normal mood and affect. Judgment normal.  Nursing note and vitals reviewed.    ED Treatments  / Results  Labs (all labs ordered are listed, but only abnormal results are displayed) Labs Reviewed  CBG MONITORING, ED - Abnormal; Notable for the following:       Result Value   Glucose-Capillary 126 (*)    All other components within normal limits  I-STAT CHEM 8, ED - Abnormal; Notable for the following:    Glucose, Bld 127 (*)    Calcium, Ion 1.10 (*)    All other components within normal limits    EKG  EKG Interpretation None       Radiology No results found.  Procedures Procedures (including critical care time)  Medications Ordered in ED Medications  ondansetron (ZOFRAN-ODT) disintegrating tablet 8 mg (not administered)  diazepam (VALIUM) tablet 5 mg (5 mg Oral Given 04/01/16 1023)  sodium chloride 0.9 % bolus 1,000 mL (0 mLs Intravenous Stopped 04/01/16 1155)  acetaminophen (TYLENOL) tablet 1,000 mg (1,000 mg Oral Given 04/01/16 1023)     Initial Impression / Assessment and Plan / ED Course  I have reviewed the triage vital signs and the nursing notes.  Pertinent labs & imaging results that were available during my care of the patient were reviewed by me and considered in my medical decision making (see chart for details).     Overall well-appearing.  Patient feels much better this time.  Nonfocal neuro exam.  I suspect this is peripheral vertigo.  She did become somewhat symptomatic when standing during orthostatics and feels better after IV fluids.  There could be a component of orthostasis involved.  Doubt central process.  No indication for imaging of her brain.  Primary care follow-up.  She understands to return to the ER for new or worsening symptoms  Final Clinical Impressions(s) / ED Diagnoses   Final diagnoses:  Dizziness  Vertigo    New Prescriptions New Prescriptions   MECLIZINE (ANTIVERT) 25 MG TABLET    Take 1 tablet (25 mg total) by mouth 3 (three) times daily as needed for dizziness.     Azalia BilisKevin Denette Hass, MD 04/01/16 [phone]

## 2016-04-08 DIAGNOSIS — R400 Somnolence: Secondary | ICD-10-CM | POA: Diagnosis not present

## 2016-04-08 DIAGNOSIS — R42 Dizziness and giddiness: Secondary | ICD-10-CM | POA: Diagnosis not present

## 2016-04-08 DIAGNOSIS — E119 Type 2 diabetes mellitus without complications: Secondary | ICD-10-CM | POA: Diagnosis not present

## 2016-04-08 DIAGNOSIS — I10 Essential (primary) hypertension: Secondary | ICD-10-CM | POA: Diagnosis not present

## 2016-05-14 DIAGNOSIS — E119 Type 2 diabetes mellitus without complications: Secondary | ICD-10-CM | POA: Diagnosis not present

## 2016-06-04 DIAGNOSIS — Z0000 Encounter for general adult medical examination without abnormal findings: Secondary | ICD-10-CM | POA: Diagnosis not present

## 2016-06-04 DIAGNOSIS — I10 Essential (primary) hypertension: Secondary | ICD-10-CM | POA: Diagnosis not present

## 2016-06-04 DIAGNOSIS — E119 Type 2 diabetes mellitus without complications: Secondary | ICD-10-CM | POA: Diagnosis not present

## 2016-06-04 DIAGNOSIS — M109 Gout, unspecified: Secondary | ICD-10-CM | POA: Diagnosis not present

## 2016-06-04 DIAGNOSIS — E538 Deficiency of other specified B group vitamins: Secondary | ICD-10-CM | POA: Diagnosis not present

## 2016-06-04 DIAGNOSIS — E559 Vitamin D deficiency, unspecified: Secondary | ICD-10-CM | POA: Diagnosis not present

## 2016-06-11 ENCOUNTER — Ambulatory Visit: Payer: BLUE CROSS/BLUE SHIELD | Admitting: Dietician

## 2016-06-11 DIAGNOSIS — R400 Somnolence: Secondary | ICD-10-CM | POA: Diagnosis not present

## 2016-06-11 DIAGNOSIS — J302 Other seasonal allergic rhinitis: Secondary | ICD-10-CM | POA: Diagnosis not present

## 2016-06-11 DIAGNOSIS — E668 Other obesity: Secondary | ICD-10-CM | POA: Diagnosis not present

## 2016-06-11 DIAGNOSIS — Z0000 Encounter for general adult medical examination without abnormal findings: Secondary | ICD-10-CM | POA: Diagnosis not present

## 2016-06-11 DIAGNOSIS — E119 Type 2 diabetes mellitus without complications: Secondary | ICD-10-CM | POA: Diagnosis not present

## 2016-06-11 DIAGNOSIS — Z23 Encounter for immunization: Secondary | ICD-10-CM | POA: Diagnosis not present

## 2016-06-11 DIAGNOSIS — Z1389 Encounter for screening for other disorder: Secondary | ICD-10-CM | POA: Diagnosis not present

## 2017-06-07 DIAGNOSIS — I10 Essential (primary) hypertension: Secondary | ICD-10-CM | POA: Diagnosis not present

## 2017-06-07 DIAGNOSIS — E119 Type 2 diabetes mellitus without complications: Secondary | ICD-10-CM | POA: Diagnosis not present

## 2017-06-07 DIAGNOSIS — E538 Deficiency of other specified B group vitamins: Secondary | ICD-10-CM | POA: Diagnosis not present

## 2017-06-07 DIAGNOSIS — M109 Gout, unspecified: Secondary | ICD-10-CM | POA: Diagnosis not present

## 2017-06-07 DIAGNOSIS — Z0000 Encounter for general adult medical examination without abnormal findings: Secondary | ICD-10-CM | POA: Diagnosis not present

## 2017-06-15 DIAGNOSIS — Z0000 Encounter for general adult medical examination without abnormal findings: Secondary | ICD-10-CM | POA: Diagnosis not present

## 2017-06-15 DIAGNOSIS — Z1389 Encounter for screening for other disorder: Secondary | ICD-10-CM | POA: Diagnosis not present

## 2017-06-15 DIAGNOSIS — R400 Somnolence: Secondary | ICD-10-CM | POA: Diagnosis not present

## 2017-06-15 DIAGNOSIS — M109 Gout, unspecified: Secondary | ICD-10-CM | POA: Diagnosis not present

## 2017-06-15 DIAGNOSIS — J302 Other seasonal allergic rhinitis: Secondary | ICD-10-CM | POA: Diagnosis not present

## 2017-06-15 DIAGNOSIS — E119 Type 2 diabetes mellitus without complications: Secondary | ICD-10-CM | POA: Diagnosis not present

## 2017-06-22 DIAGNOSIS — Z1212 Encounter for screening for malignant neoplasm of rectum: Secondary | ICD-10-CM | POA: Diagnosis not present

## 2017-06-22 DIAGNOSIS — F418 Other specified anxiety disorders: Secondary | ICD-10-CM | POA: Diagnosis not present

## 2017-07-06 DIAGNOSIS — M79676 Pain in unspecified toe(s): Secondary | ICD-10-CM

## 2017-07-14 DIAGNOSIS — F418 Other specified anxiety disorders: Secondary | ICD-10-CM | POA: Diagnosis not present

## 2017-08-03 ENCOUNTER — Emergency Department (HOSPITAL_COMMUNITY): Payer: BLUE CROSS/BLUE SHIELD

## 2017-08-03 ENCOUNTER — Encounter (HOSPITAL_COMMUNITY): Payer: Self-pay

## 2017-08-03 ENCOUNTER — Observation Stay (HOSPITAL_COMMUNITY)
Admission: EM | Admit: 2017-08-03 | Discharge: 2017-08-05 | Disposition: A | Discharge status: Home / Self Care | Payer: BLUE CROSS/BLUE SHIELD | Attending: Internal Medicine | Admitting: Internal Medicine

## 2017-08-03 DIAGNOSIS — I110 Hypertensive heart disease with heart failure: Secondary | ICD-10-CM | POA: Insufficient documentation

## 2017-08-03 DIAGNOSIS — Z7984 Long term (current) use of oral hypoglycemic drugs: Secondary | ICD-10-CM | POA: Insufficient documentation

## 2017-08-03 DIAGNOSIS — Z9889 Other specified postprocedural states: Secondary | ICD-10-CM | POA: Insufficient documentation

## 2017-08-03 DIAGNOSIS — R0789 Other chest pain: Principal | ICD-10-CM | POA: Insufficient documentation

## 2017-08-03 DIAGNOSIS — I10 Essential (primary) hypertension: Secondary | ICD-10-CM | POA: Diagnosis present

## 2017-08-03 DIAGNOSIS — E119 Type 2 diabetes mellitus without complications: Secondary | ICD-10-CM

## 2017-08-03 DIAGNOSIS — R072 Precordial pain: Secondary | ICD-10-CM

## 2017-08-03 DIAGNOSIS — E785 Hyperlipidemia, unspecified: Secondary | ICD-10-CM | POA: Diagnosis not present

## 2017-08-03 DIAGNOSIS — I5030 Unspecified diastolic (congestive) heart failure: Secondary | ICD-10-CM | POA: Diagnosis not present

## 2017-08-03 DIAGNOSIS — R079 Chest pain, unspecified: Secondary | ICD-10-CM

## 2017-08-03 DIAGNOSIS — Z833 Family history of diabetes mellitus: Secondary | ICD-10-CM | POA: Diagnosis not present

## 2017-08-03 DIAGNOSIS — Z8249 Family history of ischemic heart disease and other diseases of the circulatory system: Secondary | ICD-10-CM | POA: Diagnosis not present

## 2017-08-03 DIAGNOSIS — I160 Hypertensive urgency: Secondary | ICD-10-CM | POA: Insufficient documentation

## 2017-08-03 DIAGNOSIS — I420 Dilated cardiomyopathy: Secondary | ICD-10-CM | POA: Insufficient documentation

## 2017-08-03 DIAGNOSIS — R0602 Shortness of breath: Secondary | ICD-10-CM | POA: Diagnosis not present

## 2017-08-03 DIAGNOSIS — Z888 Allergy status to other drugs, medicaments and biological substances status: Secondary | ICD-10-CM | POA: Insufficient documentation

## 2017-08-03 DIAGNOSIS — E669 Obesity, unspecified: Secondary | ICD-10-CM | POA: Diagnosis not present

## 2017-08-03 LAB — URINALYSIS, ROUTINE W REFLEX MICROSCOPIC
Bilirubin Urine: NEGATIVE
Glucose, UA: NEGATIVE mg/dL
Hgb urine dipstick: NEGATIVE
Ketones, ur: NEGATIVE mg/dL
Nitrite: NEGATIVE
Protein, ur: NEGATIVE mg/dL
Specific Gravity, Urine: 1.004 — ABNORMAL LOW (ref 1.005–1.030)
pH: 6 (ref 5.0–8.0)

## 2017-08-03 LAB — BASIC METABOLIC PANEL
ANION GAP: 8 (ref 5–15)
BUN: 10 mg/dL (ref 8–23)
CHLORIDE: 107 mmol/L (ref 98–111)
CO2: 29 mmol/L (ref 22–32)
Calcium: 9.5 mg/dL (ref 8.9–10.3)
Creatinine, Ser: 0.99 mg/dL (ref 0.44–1.00)
GFR calc Af Amer: >60 mL/min (ref >60)
GFR, EST NON AFRICAN AMERICAN: 60 mL/min — AB (ref >60)
Glucose, Bld: 79 mg/dL (ref 70–99)
POTASSIUM: 3.7 mmol/L (ref 3.5–5.1)
Sodium: 144 mmol/L (ref 135–145)

## 2017-08-03 LAB — CBC
HEMATOCRIT: 40.8 % (ref 36.0–46.0)
HEMOGLOBIN: 13.1 g/dL (ref 12.0–15.0)
MCH: 26.5 pg (ref 26.0–34.0)
MCHC: 32.1 g/dL (ref 30.0–36.0)
MCV: 82.4 fL (ref 78.0–100.0)
Platelets: 249 10*3/uL (ref 150–400)
RBC: 4.95 MIL/uL (ref 3.87–5.11)
RDW: 13.9 % (ref 11.5–15.5)
WBC: 6.7 10*3/uL (ref 4.0–10.5)

## 2017-08-03 LAB — I-STAT TROPONIN, ED
Troponin i, poc: 0 ng/mL (ref 0.00–0.08)
Troponin i, poc: 0 ng/mL (ref 0.00–0.08)

## 2017-08-03 MED ORDER — ACETAMINOPHEN 325 MG PO TABS: 650.0000 mg | ORAL_TABLET | ORAL | Status: DC | PRN

## 2017-08-03 MED ORDER — ALPRAZOLAM 0.25 MG PO TABS: 0.2500 mg | ORAL_TABLET | Freq: Two times a day (BID) | ORAL | Status: DC | PRN

## 2017-08-03 MED ORDER — MORPHINE SULFATE (PF) 4 MG/ML IV SOLN: 2.0000 mg | INTRAVENOUS | Status: DC | PRN

## 2017-08-03 MED ORDER — ONDANSETRON HCL 4 MG/2ML IJ SOLN: 4.0000 mg | Freq: Four times a day (QID) | INTRAMUSCULAR | Status: DC | PRN

## 2017-08-03 MED ORDER — ACETAMINOPHEN 500 MG PO TABS
1000.0000 mg | ORAL_TABLET | Freq: Once | ORAL | Status: AC
Start: 2017-08-03 — End: 2017-08-03
  Administered 2017-08-03: 1000 mg via ORAL
  Filled 2017-08-03: qty 2

## 2017-08-03 MED ORDER — FAMOTIDINE IN NACL 20-0.9 MG/50ML-% IV SOLN
20.0000 mg | Freq: Two times a day (BID) | INTRAVENOUS | Status: DC
  Administered 2017-08-04 – 2017-08-05 (×4): 20 mg via INTRAVENOUS
  Filled 2017-08-03 (×4): qty 50

## 2017-08-03 MED ORDER — GI COCKTAIL ~~LOC~~: 30.0000 mL | Freq: Three times a day (TID) | ORAL | Status: DC | PRN

## 2017-08-03 MED ORDER — LISINOPRIL-HYDROCHLOROTHIAZIDE 20-12.5 MG PO TABS: 1.0000 | ORAL_TABLET | Freq: Every day | ORAL | Status: DC

## 2017-08-03 MED ORDER — INSULIN ASPART 100 UNIT/ML ~~LOC~~ SOLN: 0.0000 [IU] | Freq: Every day | SUBCUTANEOUS | Status: DC

## 2017-08-03 MED ORDER — ASPIRIN EC 81 MG PO TBEC
81.0000 mg | DELAYED_RELEASE_TABLET | Freq: Every day | ORAL | Status: DC
  Administered 2017-08-04 – 2017-08-05 (×2): 81 mg via ORAL
  Filled 2017-08-03 (×2): qty 1

## 2017-08-03 MED ORDER — LISINOPRIL 20 MG PO TABS
20.0000 mg | ORAL_TABLET | Freq: Every day | ORAL | Status: DC
  Administered 2017-08-04 – 2017-08-05 (×2): 20 mg via ORAL
  Filled 2017-08-03 (×2): qty 1

## 2017-08-03 MED ORDER — INSULIN ASPART 100 UNIT/ML ~~LOC~~ SOLN: 0.0000 [IU] | Freq: Three times a day (TID) | SUBCUTANEOUS | Status: DC

## 2017-08-03 MED ORDER — HYDROCHLOROTHIAZIDE 12.5 MG PO CAPS
12.5000 mg | ORAL_CAPSULE | Freq: Every day | ORAL | Status: DC
  Administered 2017-08-04 – 2017-08-05 (×2): 12.5 mg via ORAL
  Filled 2017-08-03 (×2): qty 1

## 2017-08-03 MED ORDER — NITROGLYCERIN 0.4 MG SL SUBL: 0.4000 mg | SUBLINGUAL_TABLET | SUBLINGUAL | Status: DC | PRN

## 2017-08-03 MED ORDER — ASPIRIN 81 MG PO CHEW
324.0000 mg | CHEWABLE_TABLET | Freq: Once | ORAL | Status: AC
Start: 2017-08-03 — End: 2017-08-04
  Administered 2017-08-04: 324 mg via ORAL
  Filled 2017-08-03: qty 4

## 2017-08-03 MED ORDER — LABETALOL HCL 5 MG/ML IV SOLN: 5.0000 mg | Freq: Once | INTRAVENOUS | Status: DC

## 2017-08-03 MED ORDER — ENOXAPARIN SODIUM 40 MG/0.4ML ~~LOC~~ SOLN
40.0000 mg | Freq: Every day | SUBCUTANEOUS | Status: DC
  Administered 2017-08-04 (×2): 40 mg via SUBCUTANEOUS
  Filled 2017-08-03 (×2): qty 0.4

## 2017-08-03 MED ORDER — HYDRALAZINE HCL 20 MG/ML IJ SOLN: 10.0000 mg | INTRAMUSCULAR | Status: DC | PRN

## 2017-08-03 NOTE — H&P (Signed)
History and Physical    Dorothy Torres:[id number] DOB: 02/05/1955 DOA: 08/03/2017  PCP: Creola Corn, MD   Patient coming from: Home  Chief Complaint: Chest pain  HPI: Dorothy Torres is a 62 y.o. female with medical history significant for hypertension, type 2 diabetes mellitus, and obesity, now presenting to the emergency department after several episodes of chest pain over the past few days.  Patient reports that she had been in her usual state of health until 1 week ago when she was experiencing nausea with recurrent nonbloody vomiting.  There was no abdominal pain associated with this and symptoms resolved over the course of the day without any intervention.  Over the ensuing days, she has had intermittent episodes of chest pain, typically while at rest but once after washing dishes, sharp in character, severe in intensity, and resolving without intervention within 1 to 30 minutes.  She denies fevers, chills, shortness breath, cough, abdominal pain, leg swelling, or leg tenderness.  ED Course: Upon arrival to the ED, patient is found to be afebrile, bradycardic in the 50s, hypertensive to 215/110, and otherwise stable.  EKG features a sinus bradycardia with rate 56 and nonspecific T wave abnormality.  Chest x-ray is negative for acute cardiopulmonary disease.  Chemistry panel and CBC are unremarkable and troponin is undetectable.  Patient took a dose of her lisinopril-HCTZ with improvement in blood pressure.  Admission is requested for further evaluation of chest pain.  Patient will be treated with aspirin 324 mg, blood pressure will be addressed, and she will be observed for ongoing evaluation and management.  Review of Systems:  All other systems reviewed and apart from HPI, are negative.  Past Medical History:  Diagnosis Date  . Allergy   . Anxiety   . Asthma   . Constipation    hard to pass her stool   . Diabetes mellitus without complication (HCC)   . Hyperlipidemia   .  Hypertension   . Obesity   . Sleep difficulties     Past Surgical History:  Procedure Laterality Date  . COLONOSCOPY    . KNEE ARTHROSCOPY    . POLYPECTOMY    . trauma lower abdomen       reports that she has never smoked. She has never used smokeless tobacco. She reports that she does not drink alcohol or use drugs.  Allergies  Allergen Reactions  . Statins Other (See Comments)    Leg pain    Family History  Problem Relation Age of Onset  . High blood pressure Mother   . Diabetes Mother   . Kidney disease Mother   . Colon polyps Mother   . Heart attack Father   . High blood pressure Father   . Colon polyps Other        cousins x multiple - maternal side   . Breast cancer Paternal Grandmother   . Colon cancer Neg Hx   . Rectal cancer Neg Hx   . Stomach cancer Neg Hx      Prior to Admission medications   Medication Sig Start Date End Date Taking? Authorizing Provider  lisinopril-hydrochlorothiazide (PRINZIDE,ZESTORETIC) 20-12.5 MG per tablet Take 1 tablet by mouth daily.    [provider]  meclizine (ANTIVERT) 25 MG tablet Take 1 tablet (25 mg total) by mouth 3 (three) times daily as needed for dizziness. 04/01/16   Azalia Bilis, MD  meloxicam (MOBIC) 15 MG tablet TAKE 1 TABLET (15 MG TOTAL) BY MOUTH DAILY. 02/27/16  Helane GuntherMayer, Gregory, DPM  metFORMIN (GLUCOPHAGE) 500 MG tablet Take 500 mg by mouth 2 (two) times daily with a meal.    [provider]    Physical Exam: Vitals:   08/03/17 1930 08/03/17 1947 08/03/17 1948 08/03/17 2000  BP: (!) 176/98 (!) 188/67 (!) 188/67 (!) 185/82  Pulse: (!) 52  88 (!) 49  Resp: 14  16 12   Temp:      TempSrc:      SpO2: 100%  99% 100%      Constitutional: NAD, calm  Eyes: PERTLA, lids and conjunctivae normal ENMT: Mucous membranes are moist. Posterior pharynx clear of any exudate or lesions.   Neck: normal, supple, no masses, no thyromegaly Respiratory: clear to auscultation bilaterally, no wheezing, no  crackles. Normal respiratory effort.  Cardiovascular: S1 & S2 heard, regular rate and rhythm. No extremity edema. No significant JVD. Abdomen: No distension, no tenderness, soft. Bowel sounds normal.  Musculoskeletal: no clubbing / cyanosis. No joint deformity upper and lower extremities.    Skin: no significant rashes, lesions, ulcers. Warm, dry, well-perfused. Neurologic: CN 2-12 grossly intact. Sensation intact. Strength 5/5 in all 4 limbs.  Psychiatric: Alert and oriented x 3. Calm and cooperative.     Labs on Admission: I have personally reviewed following labs and imaging studies  CBC: Recent Labs  Lab 08/03/17 1211  WBC 6.7  HGB 13.1  HCT 40.8  MCV 82.4  PLT 249   Basic Metabolic Panel: Recent Labs  Lab 08/03/17 1211  NA 144  K 3.7  CL 107  CO2 29  GLUCOSE 79  BUN 10  CREATININE 0.99  CALCIUM 9.5   GFR: CrCl cannot be calculated (Unknown ideal weight.). Liver Function Tests: No results for input(s): AST, ALT, ALKPHOS, BILITOT, PROT, ALBUMIN in the last 168 hours. No results for input(s): LIPASE, AMYLASE in the last 168 hours. No results for input(s): AMMONIA in the last 168 hours. Coagulation Profile: No results for input(s): INR, PROTIME in the last 168 hours. Cardiac Enzymes: No results for input(s): CKTOTAL, CKMB, CKMBINDEX, TROPONINI in the last 168 hours. BNP (last 3 results) No results for input(s): PROBNP in the last 8760 hours. HbA1C: No results for input(s): HGBA1C in the last 72 hours. CBG: No results for input(s): GLUCAP in the last 168 hours. Lipid Profile: No results for input(s): CHOL, HDL, LDLCALC, TRIG, CHOLHDL, LDLDIRECT in the last 72 hours. Thyroid Function Tests: No results for input(s): TSH, T4TOTAL, FREET4, T3FREE, THYROIDAB in the last 72 hours. Anemia Panel: No results for input(s): VITAMINB12, FOLATE, FERRITIN, TIBC, IRON, RETICCTPCT in the last 72 hours. Urine analysis:    Component Value Date/Time   COLORURINE STRAW (A)  08/03/2017 2009   APPEARANCEUR CLEAR 08/03/2017 2009   LABSPEC 1.004 (L) 08/03/2017 2009   PHURINE 6.0 08/03/2017 2009   GLUCOSEU NEGATIVE 08/03/2017 2009   HGBUR NEGATIVE 08/03/2017 2009   BILIRUBINUR NEGATIVE 08/03/2017 2009   KETONESUR NEGATIVE 08/03/2017 2009   PROTEINUR NEGATIVE 08/03/2017 2009   UROBILINOGEN 1.0 07/20/2013 1744   NITRITE NEGATIVE 08/03/2017 2009   LEUKOCYTESUR TRACE (A) 08/03/2017 2009   Sepsis Labs: @LABRCNTIP (procalcitonin:4,lacticidven:4) )No results found for this or any previous visit (from the past 240 hour(s)).   Radiological Exams on Admission: Dg Chest 2 View  Result Date: 08/03/2017 CLINICAL DATA:  62 year old female with chest pain for 3 days. EXAM: CHEST - 2 VIEW COMPARISON:  Chest radiographs 06/02/2009. FINDINGS: Upright AP and lateral views of the chest. AP lordotic positioning appears to extension weight  the cardiac size. On the lateral view cardiac size is stable at the upper limits of normal. Other mediastinal contours are within normal limits. Visualized tracheal air column is within normal limits. Mildly lower lung volumes. No pneumothorax, pulmonary edema, pleural effusion or confluent pulmonary opacity. No acute osseous abnormality identified. Negative visible bowel gas pattern. IMPRESSION: No acute cardiopulmonary abnormality. Electronically Signed   By: Odessa Fleming M.D.   On: 08/03/2017 12:42    EKG: Independently reviewed. Sinus bradycardia (rate 56), non-specific T-wave abnormalities.   Assessment/Plan  1. Chest pain  - Presents with several episodes of chest pain over the preceding 4-5 days, generally while at rest and once after washing dishes, associated with some GI sxs and possibly of primary GI-etiology  - EKG features sinus bradycardia with non-specific T-wave abnormalities  - Troponin undetectable in ED  - CXR unremarkable  - Pain resolved in ED without any intervention  - Give ASA 324 mg, continue cardiac monitoring, continue  serial troponin measurements, repeat EKG in am, control BP, trial Pepcid and GI cocktail    2. Hypertension with hypertensive urgency  - BP 215/110 in ED  - She took her home medication, lisinopril-HCTZ in ED with improvement  - Continue lisinopril-HCTZ, use prn agents for SBP >180 overnight   3. Type II DM  - No A1c on file  - Managed with metformin at home, held on admission   - Follow CBG's and use a low-intensity SSI with Novolog as needed while in hospital     DVT prophylaxis: Lovenox Code Status: Full  Family Communication: Discussed with patient  Consults called: None Admission status: Observation     Briscoe Deutscher, MD [facility] Pager [phone]  If 7PM-7AM, please contact night-coverage [email or web address] Password [facility]  08/03/2017, 8:38 PM

## 2017-08-03 NOTE — ED Triage Notes (Signed)
Onset 3 days ago intermittant chest pain that has occurred 3 times, left chest pain that radiates to left arm.  Lasts less than minute.

## 2017-08-03 NOTE — ED Notes (Signed)
Attempted to call report

## 2017-08-03 NOTE — ED Provider Notes (Signed)
[facility] EMERGENCY DEPARTMENT Provider Note   CSN: [id number] Arrival date & time: 08/03/17  1142     History   Chief Complaint Chief Complaint  Patient presents with  . Chest Pain    HPI Dorothy Torres is a 62 y.o. female with history of diabetes mellitus, hyperlipidemia, hypertension, obesity, asthma, anxiety, and constipation presents for evaluation of acute onset, intermittent left-sided chest pains for 4 days.  She states that in the past 4 days she has experienced this chest pain 3 times, most severely last night.  Pain is always left-sided and sharp, earlier today it radiated to her left upper extremity.  It is sometimes exertional, sometimes positional.  It is not pleuritic.  It is sometimes associated with light headedness.  She denies associated nausea, vomiting, shortness of breath, or diaphoresis.  She does endorse gradual onset of left-sided throbbing headache which radiates from behind the left eye up to the left temple.  This began at around 3 PM.  She states she does not typically get headaches.  She does note intermittent blurry vision bilaterally, denies numbness, tingling, or weakness.  No syncope.  She denies any recent trauma or falls.  Denies fevers, chills, leg swelling, recent travel or surgeries, hemoptysis, or prior history of DVT or PE.  She is not on OCPs or estrogen placement therapy.  She is a non-smoker, denies recreational drug use or alcohol use.  She denies any chest pain at this time.  No medications prior to arrival other than her usual home medicines.  The history is provided by the patient.    Past Medical History:  Diagnosis Date  . Allergy   . Anxiety   . Asthma   . Constipation    hard to pass her stool   . Diabetes mellitus without complication (HCC)   . Hyperlipidemia   . Hypertension   . Obesity   . Sleep difficulties     Patient Active Problem List   Diagnosis Date Noted  . Hypertension 08/03/2017  . Chest pain  08/03/2017  . Diabetes mellitus without complication (HCC) 08/03/2017  . Hypertensive urgency 08/03/2017  . Sleep difficulties     Past Surgical History:  Procedure Laterality Date  . COLONOSCOPY    . KNEE ARTHROSCOPY    . POLYPECTOMY    . trauma lower abdomen       OB History   None      Home Medications    Prior to Admission medications   Medication Sig Start Date End Date Taking? Authorizing Provider  allopurinol (ZYLOPRIM) 100 MG tablet Take 300 mg by mouth daily. 06/07/17  Yes [provider]  Cholecalciferol (VITAMIN D-3) 1000 units CAPS Take 1,000 Units by mouth daily.   Yes [provider]  ibuprofen (ADVIL,MOTRIN) 200 MG tablet Take 200 mg by mouth every 6 (six) hours as needed (for pain or headaches).   Yes [provider]  lisinopril-hydrochlorothiazide (PRINZIDE,ZESTORETIC) 20-12.5 MG per tablet Take 1 tablet by mouth daily.   Yes [provider]  metFORMIN (GLUCOPHAGE) 500 MG tablet Take 500 mg by mouth 2 (two) times daily with a meal.   Yes [provider]  Multiple Vitamins-Calcium (ONE-A-DAY WOMENS PO) Take 1 tablet by mouth daily.    Yes [provider]  rosuvastatin (CRESTOR) 5 MG tablet Take 5 mg by mouth every Monday, Wednesday, and Friday. 06/15/17  Yes [provider]  VITAMIN K PO Take 1 tablet by mouth daily.   Yes  [provider]    Family History Family History  Problem Relation Age of Onset  . High blood pressure Mother   . Diabetes Mother   . Kidney disease Mother   . Colon polyps Mother   . Heart attack Father   . High blood pressure Father   . Colon polyps Other        cousins x multiple - maternal side   . Breast cancer Paternal Grandmother   . Colon cancer Neg Hx   . Rectal cancer Neg Hx   . Stomach cancer Neg Hx     Social History Social History   Tobacco Use  . Smoking status: Never Smoker  . Smokeless tobacco: Never Used  Substance Use Topics  . Alcohol  use: No    Alcohol/week: 0.0 oz  . Drug use: No     Allergies   Statins   Review of Systems Review of Systems  Constitutional: Negative for chills and fever.  Eyes: Positive for visual disturbance. Negative for photophobia.  Respiratory: Positive for shortness of breath.   Cardiovascular: Positive for chest pain.  Gastrointestinal: Negative for abdominal pain, nausea and vomiting.  Neurological: Positive for light-headedness and headaches. Negative for syncope, weakness and numbness.  All other systems reviewed and are negative.    Physical Exam Updated Vital Signs BP (!) 159/91   Pulse (!) 51   Temp 97.8 F (36.6 C) (Oral)   Resp 15   SpO2 100%   Physical Exam  Constitutional: She is oriented to person, place, and time. She appears well-developed and well-nourished. No distress.  HENT:  Head: Normocephalic and atraumatic.  Eyes: Pupils are equal, round, and reactive to light. Conjunctivae and EOM are normal. Right eye exhibits no discharge. Left eye exhibits no discharge.  Neck: Normal range of motion. Neck supple. No JVD present. No tracheal deviation present.  Cardiovascular: Normal rate and regular rhythm.  2+ radial and DP/PT pulses bilaterally, Homans sign absent bilaterally, no lower extremity edema, no palpable cords, compartments are soft   Pulmonary/Chest: Effort normal and breath sounds normal.  Equal rise and fall of chest, no increased work of breathing.  There is tenderness to palpation of the left anterior chest wall with no deformity, crepitus, ecchymosis, or flail segment.  Abdominal: Soft. Bowel sounds are normal. She exhibits no distension. There is no tenderness.  Musculoskeletal: She exhibits no edema.       Right lower leg: Normal. She exhibits no tenderness and no edema.       Left lower leg: Normal. She exhibits no tenderness and no edema.  Neurological: She is alert and oriented to person, place, and time. She is not disoriented. No cranial nerve  deficit.  Mental Status:  Alert, thought content appropriate, able to give a coherent history. Speech fluent without evidence of aphasia. Able to follow 2 step commands without difficulty.  Cranial Nerves:  II:  Peripheral visual fields grossly normal, pupils equal, round, reactive to light III,IV, VI: ptosis not present, extra-ocular motions intact bilaterally  V,VII: smile symmetric, facial light touch sensation equal VIII: hearing grossly normal to voice  X: uvula elevates symmetrically  XI: bilateral shoulder shrug symmetric and strong XII: midline tongue extension without fassiculations Motor:  Normal tone. 5/5 strength of BUE and BLE major muscle groups including strong and equal grip strength and dorsiflexion/plantar flexion Sensory: light touch normal in all extremities. Cerebellar: normal finger-to-nose with bilateral upper extremities No pronator drift No nystagmus  Skin: Skin is warm and  dry. No erythema.  Psychiatric: She has a normal mood and affect. Her behavior is normal.  Nursing note and vitals reviewed.    ED Treatments / Results  Labs (all labs ordered are listed, but only abnormal results are displayed) Labs Reviewed  BASIC METABOLIC PANEL - Abnormal; Notable for the following components:      Result Value   GFR calc non Af Amer 60 (*)    All other components within normal limits  URINALYSIS, ROUTINE W REFLEX MICROSCOPIC - Abnormal; Notable for the following components:   Color, Urine STRAW (*)    Specific Gravity, Urine 1.004 (*)    Leukocytes, UA TRACE (*)    Bacteria, UA RARE (*)    All other components within normal limits  CBC  HIV ANTIBODY (ROUTINE TESTING)  BASIC METABOLIC PANEL  I-STAT TROPONIN, ED  I-STAT TROPONIN, ED    EKG EKG Interpretation  Date/Time:  Tuesday August 03 2017 11:49:35 EDT Ventricular Rate:  56 PR Interval:  142 QRS Duration: 80 QT Interval:  402 QTC Calculation: 387 R Axis:   29 Text Interpretation:  Sinus  bradycardia Nonspecific T wave abnormality Abnormal ECG No significant change since last tracing Confirmed by Marily MemosMesner, Jason [phone]([id number]) on 08/03/2017 6:23:46 PM   Radiology Dg Chest 2 View  Result Date: 08/03/2017 CLINICAL DATA:  62 year old female with chest pain for 3 days. EXAM: CHEST - 2 VIEW COMPARISON:  Chest radiographs 06/02/2009. FINDINGS: Upright AP and lateral views of the chest. AP lordotic positioning appears to extension weight the cardiac size. On the lateral view cardiac size is stable at the upper limits of normal. Other mediastinal contours are within normal limits. Visualized tracheal air column is within normal limits. Mildly lower lung volumes. No pneumothorax, pulmonary edema, pleural effusion or confluent pulmonary opacity. No acute osseous abnormality identified. Negative visible bowel gas pattern. IMPRESSION: No acute cardiopulmonary abnormality. Electronically Signed   By: Odessa FlemingH  Hall M.D.   On: 08/03/2017 12:42    Procedures Procedures (including critical care time)  Medications Ordered in ED Medications  aspirin chewable tablet 324 mg (has no administration in time range)  lisinopril-hydrochlorothiazide (PRINZIDE,ZESTORETIC) 20-12.5 MG per tablet 1 tablet (has no administration in time range)  aspirin EC tablet 81 mg (has no administration in time range)  nitroGLYCERIN (NITROSTAT) SL tablet 0.4 mg (has no administration in time range)  acetaminophen (TYLENOL) tablet 650 mg (has no administration in time range)  ondansetron (ZOFRAN) injection 4 mg (has no administration in time range)  enoxaparin (LOVENOX) injection 40 mg (has no administration in time range)  ALPRAZolam (XANAX) tablet 0.25 mg (has no administration in time range)  insulin aspart (novoLOG) injection 0-9 Units (has no administration in time range)  insulin aspart (novoLOG) injection 0-5 Units (has no administration in time range)  hydrALAZINE (APRESOLINE) injection 10 mg (has no administration in time range)    morphine 4 MG/ML injection 2-4 mg (has no administration in time range)  famotidine (PEPCID) IVPB 20 mg premix (has no administration in time range)  gi cocktail (Maalox,Lidocaine,Donnatal) (has no administration in time range)  acetaminophen (TYLENOL) tablet 1,000 mg (1,000 mg Oral Given 08/03/17 1921)     Initial Impression / Assessment and Plan / ED Course  I have reviewed the triage vital signs and the nursing notes.  Pertinent labs & imaging results that were available during my care of the patient were reviewed by me and considered in my medical decision making (see chart for details).    Patient with  complaint of intermittent left-sided chest pains for the past 4 days.  She is afebrile, bradycardic and hypertensive to 215/110 in the ED.  She is nontoxic in appearance.  She does have pain on palpation of the chest wall, possible musculoskeletal component of her symptoms.  She did have some improvement in her blood pressure when she took her home lisinopril HCTZ.  She was complaining of a headache, no focal neurologic deficits on examination and I have a low suspicion of SAH, ICH, or other acute intracranial abnormality.  EKG shows nonspecific T wave abnormalities, no significant changes from last tracing.  Serial troponins are negative.  Chest x-ray shows no acute cardiopulmonary abnormalities.  Remainder of lab work reviewed by me shows no leukocytosis, no anemia, no significant electrolyte abnormalities.  She has a HEART score of 5.  We will give baby aspirin and metoprolol for better control of her blood pressure.  7:46 PM Spoke with Dr. Antionette Char with [facility] who agrees to assume care of the patient and bring her to the hospital for further evaluation and management of her chest pains.  Spoke with Dr. Clayborne Dana who agrees with assessment and plan at this time. Final Clinical Impressions(s) / ED Diagnoses   Final diagnoses:  Left-sided chest pain  Hypertensive urgency     ED Discharge Orders    None       Bennye Alm 08/03/17 2322    Mesner, Barbara Cower, MD 08/07/17 [phone]

## 2017-08-04 ENCOUNTER — Observation Stay (HOSPITAL_BASED_OUTPATIENT_CLINIC_OR_DEPARTMENT_OTHER): Payer: BLUE CROSS/BLUE SHIELD

## 2017-08-04 ENCOUNTER — Other Ambulatory Visit: Payer: Self-pay

## 2017-08-04 ENCOUNTER — Encounter (HOSPITAL_COMMUNITY): Payer: Self-pay | Admitting: Surgery

## 2017-08-04 DIAGNOSIS — R079 Chest pain, unspecified: Secondary | ICD-10-CM

## 2017-08-04 DIAGNOSIS — E782 Mixed hyperlipidemia: Secondary | ICD-10-CM | POA: Diagnosis not present

## 2017-08-04 DIAGNOSIS — I160 Hypertensive urgency: Secondary | ICD-10-CM | POA: Diagnosis not present

## 2017-08-04 DIAGNOSIS — R072 Precordial pain: Secondary | ICD-10-CM | POA: Diagnosis not present

## 2017-08-04 DIAGNOSIS — I110 Hypertensive heart disease with heart failure: Secondary | ICD-10-CM | POA: Diagnosis not present

## 2017-08-04 DIAGNOSIS — E119 Type 2 diabetes mellitus without complications: Secondary | ICD-10-CM | POA: Diagnosis not present

## 2017-08-04 DIAGNOSIS — I10 Essential (primary) hypertension: Secondary | ICD-10-CM

## 2017-08-04 DIAGNOSIS — R0789 Other chest pain: Secondary | ICD-10-CM | POA: Diagnosis not present

## 2017-08-04 DIAGNOSIS — I5030 Unspecified diastolic (congestive) heart failure: Secondary | ICD-10-CM | POA: Diagnosis not present

## 2017-08-04 LAB — BASIC METABOLIC PANEL
Anion gap: 10 (ref 5–15)
BUN: 10 mg/dL (ref 8–23)
CHLORIDE: 106 mmol/L (ref 98–111)
CO2: 27 mmol/L (ref 22–32)
CREATININE: 0.99 mg/dL (ref 0.44–1.00)
Calcium: 9.2 mg/dL (ref 8.9–10.3)
GFR calc Af Amer: >60 mL/min (ref >60)
GFR calc non Af Amer: 60 mL/min — ABNORMAL LOW (ref >60)
GLUCOSE: 117 mg/dL — AB (ref 70–99)
POTASSIUM: 3.6 mmol/L (ref 3.5–5.1)
Sodium: 143 mmol/L (ref 135–145)

## 2017-08-04 LAB — LIPID PANEL
CHOLESTEROL: 159 mg/dL (ref 0–200)
HDL: 57 mg/dL (ref >40)
LDL Cholesterol: 80 mg/dL (ref 0–99)
TRIGLYCERIDES: 109 mg/dL (ref <150)
Total CHOL/HDL Ratio: 2.8 RATIO
VLDL: 22 mg/dL (ref 0–40)

## 2017-08-04 LAB — GLUCOSE, CAPILLARY
GLUCOSE-CAPILLARY: 92 mg/dL (ref 70–99)
Glucose-Capillary: 99 mg/dL (ref 70–99)
Glucose-Capillary: 167 mg/dL — ABNORMAL HIGH (ref 70–99)

## 2017-08-04 LAB — HIV ANTIBODY (ROUTINE TESTING W REFLEX): HIV Screen 4th Generation wRfx: NONREACTIVE

## 2017-08-04 LAB — ECHOCARDIOGRAM COMPLETE
HEIGHTINCHES: 61 in
Weight: 3139.2 oz

## 2017-08-04 LAB — TROPONIN I

## 2017-08-04 NOTE — Consult Note (Addendum)
Cardiology Consultation:   Patient ID: Dorothy Torres; [id number]; 1955/07/09   Admit date: 08/03/2017 Date of Consult: 08/04/2017  Primary Care Provider: Creola Corn, MD Primary Cardiologist: Dr. Eldridge Dace, new Primary Electrophysiologist:     Patient Profile:   Dorothy Torres is a 62 y.o. female with a hx of HTN, HLD, obesity, DM, and anxiety who is being seen today for the evaluation of chest pain at the request of Dr. Sunnie Nielsen.  History of Present Illness:   Dorothy Torres does not currently follow with a cardiologist. She presented to [facility] after several occasions of chest pain over the past week. One week ago, she had nausea and vomiting, but no abdominal pain. Following this illness, she developed episodes of chest pain that prompted her to seek medical attention. She was hypertensive on arrival 215/110 with sinus bradycardia. EKG nonacute and troponin negative. Cardiology was asked to consult.  On my interview, she describes sharp stabbing chest pain that occurs at rest and lasts for 2 seconds. The chest pain first occurred on Friday night when she was at rest. She was able to rest that evening and do her normal Sat activities, but she had a recurrence of the chest pain on both Sat and Sunday nights. Again, pain lasted only 1-2 seconds, without radiation, and without associated symptoms. She reached out to her PCP on Monday and came to the ER on Tues, 08/03/17, for evaluation. Her son lives with her and is a source of stress. When her son came to visit today, she had another episode of chest pain, similar to those described above. She denies palpitations, SOB, DOE, lower extremity swelling, and orthopnea. No aggravating or relieving factors.  She has a family history of heart disease in both of her parents, father died of a heart attack in his early 53s. Brother has HTN and HLD.   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    Past Medical History:  Diagnosis  Date  . Allergy   . Anxiety   . Asthma   . Constipation    hard to pass her stool   . Diabetes mellitus without complication (HCC)   . Hyperlipidemia   . Hypertension   . Obesity   . Sleep difficulties     Past Surgical History:  Procedure Laterality Date  . COLONOSCOPY    . KNEE ARTHROSCOPY    . POLYPECTOMY    . trauma lower abdomen       Home Medications:  Prior to Admission medications   Medication Sig Start Date End Date Taking? Authorizing Provider  allopurinol (ZYLOPRIM) 100 MG tablet Take 300 mg by mouth daily. 06/07/17  Yes [provider]  Cholecalciferol (VITAMIN D-3) 1000 units CAPS Take 1,000 Units by mouth daily.   Yes [provider]  ibuprofen (ADVIL,MOTRIN) 200 MG tablet Take 200 mg by mouth every 6 (six) hours as needed (for pain or headaches).   Yes [provider]  lisinopril-hydrochlorothiazide (PRINZIDE,ZESTORETIC) 20-12.5 MG per tablet Take 1 tablet by mouth daily.   Yes [provider]  metFORMIN (GLUCOPHAGE) 500 MG tablet Take 500 mg by mouth 2 (two) times daily with a meal.   Yes [provider]  Multiple Vitamins-Calcium (ONE-A-DAY WOMENS PO) Take 1 tablet by mouth daily.    Yes [provider]  rosuvastatin (CRESTOR) 5 MG tablet Take 5 mg by mouth every Monday, Wednesday, and Friday. 06/15/17  Yes [provider]  VITAMIN K PO  Take 1 tablet by mouth daily.   Yes [provider]    Inpatient Medications: Scheduled Meds: . aspirin EC  81 mg Oral Daily  . enoxaparin (LOVENOX) injection  40 mg Subcutaneous QHS  . lisinopril  20 mg Oral Daily   And  . hydrochlorothiazide  12.5 mg Oral Daily  . insulin aspart  0-5 Units Subcutaneous QHS  . insulin aspart  0-9 Units Subcutaneous TID WC   Continuous Infusions: . famotidine (PEPCID) IV 20 mg (08/04/17 1026)   PRN Meds: acetaminophen, ALPRAZolam, gi cocktail, hydrALAZINE, morphine injection, nitroGLYCERIN, ondansetron (ZOFRAN)  IV  Allergies:    Allergies  Allergen Reactions  . Statins Other (See Comments)    Leg pain    Social History:   Social History   Socioeconomic History  . Marital status: Single    Spouse name: Not on file  . Number of children: 1  . Years of education: college  . Highest education level: Not on file  Occupational History  . Not on file  Social Needs  . Financial resource strain: Not on file  . Food insecurity:    Worry: Not on file    Inability: Not on file  . Transportation needs:    Medical: Not on file    Non-medical: Not on file  Tobacco Use  . Smoking status: Never Smoker  . Smokeless tobacco: Never Used  Substance and Sexual Activity  . Alcohol use: No    Alcohol/week: 0.0 oz  . Drug use: No  . Sexual activity: Not on file  Lifestyle  . Physical activity:    Days per week: Not on file    Minutes per session: Not on file  . Stress: Not on file  Relationships  . Social connections:    Talks on phone: Not on file    Gets together: Not on file    Attends religious service: Not on file    Active member of club or organization: Not on file    Attends meetings of clubs or organizations: Not on file    Relationship status: Not on file  . Intimate partner violence:    Fear of current or ex partner: Not on file    Emotionally abused: Not on file    Physically abused: Not on file    Forced sexual activity: Not on file  Other Topics Concern  . Not on file  Social History Narrative   Patient lives at home alone. Patient is single.   Patient works part time.   Education BA. College.   Right handed.   Caffeine two cups of coffee daily.    Family History:    Family History  Problem Relation Age of Onset  . High blood pressure Mother   . Diabetes Mother   . Kidney disease Mother   . Colon polyps Mother   . Heart attack Father   . High blood pressure Father   . Colon polyps Other        cousins x multiple - maternal side   . Breast cancer Paternal  Grandmother   . Colon cancer Neg Hx   . Rectal cancer Neg Hx   . Stomach cancer Neg Hx      ROS:  Please see the history of present illness.   All other ROS reviewed and negative.     Physical Exam/Data:   Vitals:   08/03/17 2345 08/04/17 0100 08/04/17 0610 08/04/17 1237  BP: (!) 178/76  134/73 132/73  Pulse: Marland Kitchen(!)  55  (!) 52 (!) 49  Resp: 18  18 20   Temp: 98.3 F (36.8 C)  97.6 F (36.4 C) 97.8 F (36.6 C)  TempSrc: Oral  Oral Oral  SpO2: 100%  100% 99%  Weight:  196 lb 3.2 oz (89 kg)    Height:   5\' 1"  (1.549 m)     Intake/Output Summary (Last 24 hours) at 08/04/2017 1417 Last data filed at 08/04/2017 0930 Gross per 24 hour  Intake 381.67 ml  Output -  Net 381.67 ml   Filed Weights   08/04/17 0100  Weight: 196 lb 3.2 oz (89 kg)   Body mass index is 37.07 kg/m.  General:  Well nourished, well developed, in no acute distress HEENT: normal Neck: no JVD Vascular: No carotid bruits Cardiac:  normal S1, S2; RRR; no murmur  Lungs:  clear to auscultation bilaterally, no wheezing, rhonchi or rales  Abd: soft, nontender, no hepatomegaly  Ext: no edema Musculoskeletal:  No deformities, BUE and BLE strength normal and equal Skin: warm and dry  Neuro:  CNs 2-12 intact, no focal abnormalities noted Psych:  Normal affect   EKG:  The EKG was personally reviewed and demonstrates:  TWI in inferior leads, now resolved, sinus brady Telemetry:  Telemetry was personally reviewed and demonstrates:  Sinus - sinus brady  Relevant CV Studies:  Echo - pending read  Laboratory Data:  Chemistry Recent Labs  Lab 08/03/17 1211 08/04/17 0452  NA 144 143  K 3.7 3.6  CL 107 106  CO2 29 27  GLUCOSE 79 117*  BUN 10 10  CREATININE 0.99 0.99  CALCIUM 9.5 9.2  GFRNONAA 60* 60*  GFRAA >60 >60  ANIONGAP 8 10    No results for input(s): PROT, ALBUMIN, AST, ALT, ALKPHOS, BILITOT in the last 168 hours. Hematology Recent Labs  Lab 08/03/17 1211  WBC 6.7  RBC 4.95  HGB 13.1    HCT 40.8  MCV 82.4  MCH 26.5  MCHC 32.1  RDW 13.9  PLT 249   Cardiac Enzymes Recent Labs  Lab 08/04/17 0900  TROPONINI <0.03    Recent Labs  Lab 08/03/17 1300 08/03/17 1826  TROPIPOC 0.00 0.00    BNPNo results for input(s): BNP, PROBNP in the last 168 hours.  DDimer No results for input(s): DDIMER in the last 168 hours.  Radiology/Studies:  Dg Chest 2 View  Result Date: 08/03/2017 CLINICAL DATA:  62 year old female with chest pain for 3 days. EXAM: CHEST - 2 VIEW COMPARISON:  Chest radiographs 06/02/2009. FINDINGS: Upright AP and lateral views of the chest. AP lordotic positioning appears to extension weight the cardiac size. On the lateral view cardiac size is stable at the upper limits of normal. Other mediastinal contours are within normal limits. Visualized tracheal air column is within normal limits. Mildly lower lung volumes. No pneumothorax, pulmonary edema, pleural effusion or confluent pulmonary opacity. No acute osseous abnormality identified. Negative visible bowel gas pattern. IMPRESSION: No acute cardiopulmonary abnormality. Electronically Signed   By: Odessa Fleming M.D.   On: 08/03/2017 12:42    Assessment and Plan:   1. Chest pain - troponin x 3 negative - EKG with initial TWI in inferior leads, now resolved - she has risk factors for CAD, including HTN, HLD, DM, obesity, and family history - echo results pending - if echo normal without RWMA, will obtain CT coronary tomorrow - if echo is abnormal, will consider heart cath tomorrow   2. Hypertensive urgency - BP in ER as  high as 215-110, now resolved - home meds include lisinopril-HCTZ   3. HLD - 08/04/2017: Cholesterol 159; HDL 57; LDL Cholesterol 80; Triglycerides 109; VLDL 22 - on crestor 5 mg   4. DM - per primary - last A1c unknown    For questions or updates, please contact [facility] Please consult [email or web address] for contact info under Cardiology/STEMI.   Signed, Marcelino Duster, [place]   08/04/2017 2:17 PM   I have examined the patient and reviewed assessment and plan and discussed with patient.  Agree with above as stated.  Plan for CT angio of coronaries to evaluate for CAD.  Echo pending as well.  She reports stress from recent interactions with her son.  Lance Muss

## 2017-08-04 NOTE — Progress Notes (Signed)
PROGRESS NOTE    Dorothy Torres  WGN:[id number] DOB: [birth year] DOA: 08/03/2017 PCP: Creola Cornusso, John, MD    Brief Narrative: Dorothy Torres is a 62 y.o. female with medical history significant for hypertension, type 2 diabetes mellitus, and obesity, now presenting to the emergency department after several episodes of chest pain over the past few days.  Patient reports that she had been in her usual state of health until 1 week ago when she was experiencing nausea with recurrent nonbloody vomiting.  There was no abdominal pain associated with this and symptoms resolved over the course of the day without any intervention.  Over the ensuing days, she has had intermittent episodes of chest pain, typically while at rest but once after washing dishes, sharp in character, severe in intensity, and resolving without intervention within 1 to 30 minutes.  She denies fevers, chills, shortness breath, cough, abdominal pain, leg swelling, or leg tenderness.  ED Course: Upon arrival to the ED, patient is found to be afebrile, bradycardic in the 50s, hypertensive to 215/110, and otherwise stable.  EKG features a sinus bradycardia with rate 56 and nonspecific T wave abnormality.  Chest x-ray is negative for acute cardiopulmonary disease.  Chemistry panel and CBC are unremarkable and troponin is undetectable.  Patient took a dose of her lisinopril-HCTZ with improvement in blood pressure.  Admission is requested for further evaluation of chest pain.  Patient will be treated with aspirin 324 mg, blood pressure will be addressed, and she will be observed for ongoing evaluation and management    Assessment & Plan:   Principal Problem:   Chest pain Active Problems:   Hypertension   Diabetes mellitus without complication (HCC)   Hypertensive urgency   1-Chest pain;  Had an episode of HTN yesterday.  She is complaint with medications.  Troponin negative.  Multiples risk factors. Check lipid panel.  Cardiology  consulted.  ECHO pending.    Hypertension with hypertensive urgency  Improved. Continue with HCTZ and lisinopril.   DM type 2; hold metformin.  SSI.     DVT prophylaxis: lovenox.  Code Status: full code.  Family Communication: care discussed with patient.  Disposition Plan: cardiology evaluation.   Consultants:   Cardiology    Procedures:   ECHO; pending.    Subjective: She report chest pain on and off, last few seconds. For last few days.  Denies reflux.    Objective: Vitals:   08/03/17 2345 08/04/17 0100 08/04/17 0610 08/04/17 1237  BP: (!) 178/76  134/73 132/73  Pulse: (!) 55  (!) 52 (!) 49  Resp: 18  18 20   Temp: 98.3 F (36.8 C)  97.6 F (36.4 C) 97.8 F (36.6 C)  TempSrc: Oral  Oral Oral  SpO2: 100%  100% 99%  Weight:  89 kg (196 lb 3.2 oz)    Height:   5\' 1"  (1.549 m)     Intake/Output Summary (Last 24 hours) at 08/04/2017 1532 Last data filed at 08/04/2017 1502 Gross per 24 hour  Intake 381.67 ml  Output -  Net 381.67 ml   Filed Weights   08/04/17 0100  Weight: 89 kg (196 lb 3.2 oz)    Examination:  General exam: Appears calm and comfortable  Respiratory system: Clear to auscultation. Respiratory effort normal. Cardiovascular system: S1 & S2 heard, RRR. No JVD, murmurs, rubs, gallops or clicks. No pedal edema. Gastrointestinal system: Abdomen is nondistended, soft and nontender. No organomegaly or masses felt. Normal bowel sounds heard. Central nervous system: Alert  and oriented. No focal neurological deficits. Extremities: Symmetric 5 x 5 power. Skin: No rashes, lesions or ulcers Psychiatry: Judgement and insight appear normal. Mood & affect appropriate.     Data Reviewed: I have personally reviewed following labs and imaging studies  CBC: Recent Labs  Lab 08/03/17 1211  WBC 6.7  HGB 13.1  HCT 40.8  MCV 82.4  PLT 249   Basic Metabolic Panel: Recent Labs  Lab 08/03/17 1211 08/04/17 0452  NA 144 143  K 3.7 3.6  CL 107  106  CO2 29 27  GLUCOSE 79 117*  BUN 10 10  CREATININE 0.99 0.99  CALCIUM 9.5 9.2   GFR: Estimated Creatinine Clearance: 59.8 mL/min (by C-G formula based on SCr of 0.99 mg/dL). Liver Function Tests: No results for input(s): AST, ALT, ALKPHOS, BILITOT, PROT, ALBUMIN in the last 168 hours. No results for input(s): LIPASE, AMYLASE in the last 168 hours. No results for input(s): AMMONIA in the last 168 hours. Coagulation Profile: No results for input(s): INR, PROTIME in the last 168 hours. Cardiac Enzymes: Recent Labs  Lab 08/04/17 0900  TROPONINI <0.03   BNP (last 3 results) No results for input(s): PROBNP in the last 8760 hours. HbA1C: No results for input(s): HGBA1C in the last 72 hours. CBG: Recent Labs  Lab 08/04/17 1150  GLUCAP 99   Lipid Profile: Recent Labs    08/04/17 0900  CHOL 159  HDL 57  LDLCALC 80  TRIG 109  CHOLHDL 2.8   Thyroid Function Tests: No results for input(s): TSH, T4TOTAL, FREET4, T3FREE, THYROIDAB in the last 72 hours. Anemia Panel: No results for input(s): VITAMINB12, FOLATE, FERRITIN, TIBC, IRON, RETICCTPCT in the last 72 hours. Sepsis Labs: No results for input(s): PROCALCITON, LATICACIDVEN in the last 168 hours.  No results found for this or any previous visit (from the past 240 hour(s)).       Radiology Studies: Dg Chest 2 View  Result Date: 08/03/2017 CLINICAL DATA:  62 year old female with chest pain for 3 days. EXAM: CHEST - 2 VIEW COMPARISON:  Chest radiographs 06/02/2009. FINDINGS: Upright AP and lateral views of the chest. AP lordotic positioning appears to extension weight the cardiac size. On the lateral view cardiac size is stable at the upper limits of normal. Other mediastinal contours are within normal limits. Visualized tracheal air column is within normal limits. Mildly lower lung volumes. No pneumothorax, pulmonary edema, pleural effusion or confluent pulmonary opacity. No acute osseous abnormality identified.  Negative visible bowel gas pattern. IMPRESSION: No acute cardiopulmonary abnormality. Electronically Signed   By: Odessa Fleming M.D.   On: 08/03/2017 12:42        Scheduled Meds: . aspirin EC  81 mg Oral Daily  . enoxaparin (LOVENOX) injection  40 mg Subcutaneous QHS  . lisinopril  20 mg Oral Daily   And  . hydrochlorothiazide  12.5 mg Oral Daily  . insulin aspart  0-5 Units Subcutaneous QHS  . insulin aspart  0-9 Units Subcutaneous TID WC   Continuous Infusions: . famotidine (PEPCID) IV 20 mg (08/04/17 1026)     LOS: 0 days    Time spent: 35 minutes.     Alba Cory, MD [facility] Pager [phone]  If 7PM-7AM, please contact night-coverage [email or web address] Password [facility] 08/04/2017, 3:32 PM

## 2017-08-04 NOTE — Progress Notes (Signed)
  Echocardiogram 2D Echocardiogram has been performed.  Dorothy Torres  Dorothy Torres 08/04/2017, 2:30 PM

## 2017-08-05 ENCOUNTER — Observation Stay (HOSPITAL_BASED_OUTPATIENT_CLINIC_OR_DEPARTMENT_OTHER): Payer: BLUE CROSS/BLUE SHIELD

## 2017-08-05 DIAGNOSIS — I110 Hypertensive heart disease with heart failure: Secondary | ICD-10-CM | POA: Diagnosis not present

## 2017-08-05 DIAGNOSIS — E782 Mixed hyperlipidemia: Secondary | ICD-10-CM

## 2017-08-05 DIAGNOSIS — R072 Precordial pain: Secondary | ICD-10-CM | POA: Diagnosis not present

## 2017-08-05 DIAGNOSIS — I5030 Unspecified diastolic (congestive) heart failure: Secondary | ICD-10-CM | POA: Diagnosis not present

## 2017-08-05 DIAGNOSIS — R079 Chest pain, unspecified: Secondary | ICD-10-CM | POA: Diagnosis not present

## 2017-08-05 DIAGNOSIS — I160 Hypertensive urgency: Secondary | ICD-10-CM | POA: Diagnosis not present

## 2017-08-05 DIAGNOSIS — R0789 Other chest pain: Secondary | ICD-10-CM | POA: Diagnosis not present

## 2017-08-05 DIAGNOSIS — I10 Essential (primary) hypertension: Secondary | ICD-10-CM | POA: Diagnosis not present

## 2017-08-05 LAB — NM MYOCAR MULTI W/SPECT W/WALL MOTION / EF
Peak HR: 101 {beats}/min
Rest HR: 56 {beats}/min

## 2017-08-05 LAB — GLUCOSE, CAPILLARY
GLUCOSE-CAPILLARY: 126 mg/dL — AB (ref 70–99)
Glucose-Capillary: 100 mg/dL — ABNORMAL HIGH (ref 70–99)

## 2017-08-05 MED ORDER — TECHNETIUM TC 99M TETROFOSMIN IV KIT
30.0000 | PACK | Freq: Once | INTRAVENOUS | Status: AC | PRN
  Administered 2017-08-05: 30 via INTRAVENOUS

## 2017-08-05 MED ORDER — HYDROCHLOROTHIAZIDE 25 MG PO TABS: 25.0000 mg | ORAL_TABLET | Freq: Every day | ORAL | Status: DC

## 2017-08-05 MED ORDER — ASPIRIN 81 MG PO TBEC: 81.0000 mg | DELAYED_RELEASE_TABLET | Freq: Every day | ORAL | 0 refills | Status: DC

## 2017-08-05 MED ORDER — HYDROCHLOROTHIAZIDE 25 MG PO TABS: 25.0000 mg | ORAL_TABLET | Freq: Every day | ORAL | 0 refills | Status: AC

## 2017-08-05 MED ORDER — TECHNETIUM TC 99M TETROFOSMIN IV KIT
10.0000 | PACK | Freq: Once | INTRAVENOUS | Status: AC | PRN
  Administered 2017-08-05: 10 via INTRAVENOUS

## 2017-08-05 MED ORDER — AMLODIPINE BESYLATE 5 MG PO TABS: 5.0000 mg | ORAL_TABLET | Freq: Every day | ORAL | 0 refills | Status: AC

## 2017-08-05 MED ORDER — REGADENOSON 0.4 MG/5ML IV SOLN
0.4000 mg | Freq: Once | INTRAVENOUS | Status: AC
  Administered 2017-08-05: 0.4 mg via INTRAVENOUS
  Filled 2017-08-05: qty 5

## 2017-08-05 MED ORDER — AMLODIPINE BESYLATE 5 MG PO TABS
5.0000 mg | ORAL_TABLET | Freq: Every day | ORAL | Status: DC
  Administered 2017-08-05: 5 mg via ORAL
  Filled 2017-08-05: qty 1

## 2017-08-05 MED ORDER — REGADENOSON 0.4 MG/5ML IV SOLN
INTRAVENOUS | Status: AC
  Administered 2017-08-05: 0.4 mg via INTRAVENOUS
  Filled 2017-08-05: qty 5

## 2017-08-05 MED ORDER — LISINOPRIL 20 MG PO TABS: 20.0000 mg | ORAL_TABLET | Freq: Every day | ORAL | 0 refills | Status: DC

## 2017-08-05 MED ORDER — HYDROCHLOROTHIAZIDE 25 MG PO TABS
25.0000 mg | ORAL_TABLET | Freq: Every day | ORAL | Status: DC
  Filled 2017-08-05: qty 1

## 2017-08-05 NOTE — Progress Notes (Signed)
Patient still in Nuc Med study  AM medications will be given upon her return to room.

## 2017-08-05 NOTE — Progress Notes (Addendum)
Progress Note  Patient Name: Dorothy Torres Date of Encounter: 08/05/2017  Primary Cardiologist: Lance MussJayadeep Vendetta Pittinger, MD   Subjective   Pt seen in stress lab. Denies chest pain or shortness of breath.   Inpatient Medications    Scheduled Meds: . aspirin EC  81 mg Oral Daily  . enoxaparin (LOVENOX) injection  40 mg Subcutaneous QHS  . lisinopril  20 mg Oral Daily   And  . hydrochlorothiazide  12.5 mg Oral Daily  . insulin aspart  0-5 Units Subcutaneous QHS  . insulin aspart  0-9 Units Subcutaneous TID WC   Continuous Infusions: . famotidine (PEPCID) IV 20 mg (08/04/17 2126)   PRN Meds: acetaminophen, ALPRAZolam, gi cocktail, hydrALAZINE, morphine injection, nitroGLYCERIN, ondansetron (ZOFRAN) IV   Vital Signs    Vitals:   08/04/17 1237 08/04/17 2111 08/04/17 2346 08/05/17 0532  BP: 132/73 (!) 144/69 133/75 (!) 123/52  Pulse: (!) 49 63 (!) 50 (!) 56  Resp: 20 16 16 18   Temp: 97.8 F (36.6 C) 98.2 F (36.8 C) 98.1 F (36.7 C) 98.7 F (37.1 C)  TempSrc: Oral Oral Oral Oral  SpO2: 99% 98% 98% 98%  Weight:    194 lb 1.6 oz (88 kg)  Height:        Intake/Output Summary (Last 24 hours) at 08/05/2017 1039 Last data filed at 08/05/2017 0534 Gross per 24 hour  Intake 240 ml  Output 1100 ml  Net -860 ml   Filed Weights   08/04/17 0100 08/05/17 0532  Weight: 196 lb 3.2 oz (89 kg) 194 lb 1.6 oz (88 kg)    Telemetry    SR/SB  ECG    Sinus bradycardia with LVH - Personally Reviewed  Physical Exam   GEN: No acute distress.   Neck: No JVD Cardiac: RRR, no murmurs, rubs, or gallops.  Respiratory: Clear to auscultation bilaterally. GI: Soft, nontender, non-distended  MS: No edema; No deformity. Neuro:  Nonfocal  Psych: Normal affect   Labs    Chemistry Recent Labs  Lab 08/03/17 1211 08/04/17 0452  NA 144 143  K 3.7 3.6  CL 107 106  CO2 29 27  GLUCOSE 79 117*  BUN 10 10  CREATININE 0.99 0.99  CALCIUM 9.5 9.2  GFRNONAA 60* 60*  GFRAA >60 >60    ANIONGAP 8 10     Hematology Recent Labs  Lab 08/03/17 1211  WBC 6.7  RBC 4.95  HGB 13.1  HCT 40.8  MCV 82.4  MCH 26.5  MCHC 32.1  RDW 13.9  PLT 249    Cardiac Enzymes Recent Labs  Lab 08/04/17 0900  TROPONINI <0.03    Recent Labs  Lab 08/03/17 1300 08/03/17 1826  TROPIPOC 0.00 0.00     BNPNo results for input(s): BNP, PROBNP in the last 168 hours.   DDimer No results for input(s): DDIMER in the last 168 hours.   Radiology    Dg Chest 2 View  Result Date: 08/03/2017 CLINICAL DATA:  62 year old female with chest pain for 3 days. EXAM: CHEST - 2 VIEW COMPARISON:  Chest radiographs 06/02/2009. FINDINGS: Upright AP and lateral views of the chest. AP lordotic positioning appears to extension weight the cardiac size. On the lateral view cardiac size is stable at the upper limits of normal. Other mediastinal contours are within normal limits. Visualized tracheal air column is within normal limits. Mildly lower lung volumes. No pneumothorax, pulmonary edema, pleural effusion or confluent pulmonary opacity. No acute osseous abnormality identified. Negative visible bowel gas pattern.  IMPRESSION: No acute cardiopulmonary abnormality. Electronically Signed   By: Odessa Fleming M.D.   On: 08/03/2017 12:42    Cardiac Studies   Echocardiogram 08/04/17 Study Conclusions  - Left ventricle: The cavity size was normal. Systolic function was normal. The estimated ejection fraction was in the range of 60% to 65%. Wall motion was normal; there were no regional wall motion abnormalities. Features are consistent with a pseudonormal left ventricular filling pattern, with concomitant abnormal relaxation and increased filling pressure (grade 2 diastolic dysfunction). Doppler parameters are consistent with indeterminate ventricular filling pressure. - Aortic valve: Transvalvular velocity was within the normal range. There was no stenosis. There was no regurgitation. Valve area (VTI): 1.81 cm^2. Valve  area (Vmean): 1.78 cm^2. - Mitral valve: Transvalvular velocity was within the normal range. There was no evidence for stenosis. There was no regurgitation. - Left atrium: The atrium was mildly to moderately dilated. - Right ventricle: The cavity size was normal. Wall thickness was normal. - Atrial septum: No defect or patent foramen ovale was identified. - Tricuspid valve: There was trivial regurgitation. - Pulmonary arteries: Systolic pressure was within the normal range. PA peak pressure: 26 mm Hg (S).  Patient Profile     62 y.o. female with a hx of HTN, HLD, obesity, DM, and anxiety who is being seen for the evaluation of chest pain.  Assessment & Plan    1. Chest pain - troponin x 3 negative - EKG with initial TWI in inferior leads, now resolved - she has risk factors for CAD, including HTN, HLD, DM, obesity, and family history - echo showed normal LV function and wall motion - She is currently in the stress lab for nuclear stress test, pending results  2. Hypertensive urgency - BP in ER as high as 215-110, now resolved - home meds include lisinopril-HCTZ which have been continued.    3. HLD - 08/04/2017: Cholesterol 159; HDL 57; LDL Cholesterol 80; Triglycerides 109; VLDL 22 - on crestor 5 mg  4. DM - per primary - last A1c unknown       For questions or updates, please contact [facility] Please consult [email or web address] for contact info under Cardiology/STEMI.      Signed, Berton Bon, NP  08/05/2017, 10:39 AM    I have examined the patient and reviewed assessment and plan and discussed with patient.  Agree with above as stated.  Low risk stress test and echo.  OK to discharge.  COntinue with aggressive RF modification.  She would benefit from exercise and weight loss.   Lance Muss

## 2017-08-05 NOTE — Progress Notes (Signed)
    Patient presented for Lexiscan nuclear stress test. Tolerated procedure well. Pending final stress imaging result.  Berton BonJanine Mistina Coatney, AGNP-C 08/05/2017  11:05 AM Pager: [phone]

## 2017-08-05 NOTE — Progress Notes (Signed)
Reviewed all discharge instructions with patient and she stated understanding of all. She confirmed she has personal belongings of clothing, cell phone, phone charger, 1 eye contact, 1 denture, and personal toiletries that she brought to hospital with her.  No voiced complaints.

## 2017-08-05 NOTE — Progress Notes (Signed)
MD called to inquire if patient has 18 gauge IV access for cardiac CT.  Upon review of chart, IV nurse started a 20 gauge last night at 2310.  Md informed.  He requested a 2nd IV consult for 18 gauge and if unable to inform.  Patent updated on plan of care and she refused another IV stick.  Patient was given explanation for reason for bigger IV and she still confused.  MD will be notified of patient's refusal and IV consult order will be d/c'd.

## 2017-08-05 NOTE — Discharge Summary (Signed)
Physician Discharge Summary  Dorothy Torres NWG:[id number] DOB: [birth year] DOA: 08/03/2017  PCP: Creola Corn, MD  Admit date: 08/03/2017 Discharge date: 08/05/2017  Admitted From: home Disposition: Home   Recommendations for Outpatient Follow-up:  1. Follow up with PCP in 1-2 weeks 2. Please obtain BMP/CBC in one week 3. Further controlled of BP.  4. Follow up cardiology for diastolic dysfunction.    Discharge Condition Stable.  CODE STATUS: full code.  Diet recommendation: Heart Healthy   Brief/Interim Summary: Brief Narrative: Dorothy Torres a 62 y.o.femalewith medical history significant forhypertension, type 2 diabetes mellitus, and obesity, now presenting to the emergency department after several episodes of chest pain over the past few days. Patient reports that she had been in her usual state of health until 1 week ago when she was experiencing nausea with recurrent nonbloody vomiting. There was no abdominal pain associated with this and symptoms resolved over the course of the day without any intervention. Over the ensuing days, she has had intermittent episodes of chest pain, typically while at rest but once after washing dishes, sharp in character, severe in intensity, and resolving without intervention within 1 to 30 minutes. She denies fevers, chills, shortness breath, cough, abdominal pain, leg swelling, or leg tenderness.  ED Course:Upon arrival to the ED, patient is found to be afebrile, bradycardic in the 50s, hypertensive to 215/110, and otherwise stable. EKG features a sinus bradycardia with rate 56 and nonspecific T wave abnormality. Chest x-ray is negative for acute cardiopulmonary disease. Chemistry panel and CBC are unremarkable and troponin is undetectable. Patient took a dose of her lisinopril-HCTZ with improvement in blood pressure. Admission is requested for further evaluation of chest pain. Patient will be treated with aspirin 324 mg, blood pressure  will be addressed, and she will be observed for ongoing evaluation and management    Assessment & Plan:   Principal Problem:   Chest pain Active Problems:   Hypertension   Diabetes mellitus without complication (HCC)   Hypertensive urgency   1-Chest pain;  Had an episode of HTN yesterday.  She is complaint with medications.  Troponin negative.  Multiples risk factors. Check lipid panel.  Cardiology consulted.  ECHO diastolic dysfunction.  Stress test normal;/  stable for discharge.  Suspect chest pain related to HTN.   Hypertension with hypertensive urgency Continue with HCTZ and lisinopril.  increase HCTZ to 25 mg/  Will also add Norvasc low dose.   DM type 2; Resume metformin.  SSI.      Discharge Diagnoses:  Principal Problem:   Chest pain Active Problems:   Hypertension   Diabetes mellitus without complication [facility])   Hypertensive urgency    Discharge Instructions  Discharge Instructions    Diet - low sodium heart healthy   Complete by:  As directed    Increase activity slowly   Complete by:  As directed      Allergies as of 08/05/2017      Reactions   Statins Other (See Comments)   Leg pain      Medication List    STOP taking these medications   ibuprofen 200 MG tablet Commonly known as:  ADVIL,MOTRIN   lisinopril-hydrochlorothiazide 20-12.5 MG tablet Commonly known as:  PRINZIDE,ZESTORETIC     TAKE these medications   allopurinol 100 MG tablet Commonly known as:  ZYLOPRIM Take 300 mg by mouth daily.   amLODipine 5 MG tablet Commonly known as:  NORVASC Take 1 tablet (5 mg total) by mouth daily.  aspirin 81 MG EC tablet Take 1 tablet (81 mg total) by mouth daily. Start taking on:  08/06/2017   hydrochlorothiazide 25 MG tablet Commonly known as:  HYDRODIURIL Take 1 tablet (25 mg total) by mouth daily. Start taking on:  08/06/2017   lisinopril 20 MG tablet Commonly known as:  PRINIVIL,ZESTRIL Take 1 tablet (20 mg  total) by mouth daily. Start taking on:  08/06/2017   metFORMIN 500 MG tablet Commonly known as:  GLUCOPHAGE Take 500 mg by mouth 2 (two) times daily with a meal.   ONE-A-DAY WOMENS PO Take 1 tablet by mouth daily.   rosuvastatin 5 MG tablet Commonly known as:  CRESTOR Take 5 mg by mouth every Monday, Wednesday, and Friday.   Vitamin D-3 1000 units Caps Take 1,000 Units by mouth daily.   VITAMIN K PO Take 1 tablet by mouth daily.       Allergies  Allergen Reactions  . Statins Other (See Comments)    Leg pain    Consultations: Cardiology   Procedures/Studies: Dg Chest 2 View  Result Date: 08/03/2017 CLINICAL DATA:  62 year old female with chest pain for 3 days. EXAM: CHEST - 2 VIEW COMPARISON:  Chest radiographs 06/02/2009. FINDINGS: Upright AP and lateral views of the chest. AP lordotic positioning appears to extension weight the cardiac size. On the lateral view cardiac size is stable at the upper limits of normal. Other mediastinal contours are within normal limits. Visualized tracheal air column is within normal limits. Mildly lower lung volumes. No pneumothorax, pulmonary edema, pleural effusion or confluent pulmonary opacity. No acute osseous abnormality identified. Negative visible bowel gas pattern. IMPRESSION: No acute cardiopulmonary abnormality. Electronically Signed   By: Odessa Fleming M.D.   On: 08/03/2017 12:42   Nm Myocar Multi W/spect W/wall Motion / Ef  Result Date: 08/05/2017 CLINICAL DATA:  Acute cardiac syndrome.  Chest pain for 5 days. EXAM: MYOCARDIAL IMAGING WITH SPECT (REST AND PHARMACOLOGIC-STRESS) GATED LEFT VENTRICULAR WALL MOTION STUDY LEFT VENTRICULAR EJECTION FRACTION TECHNIQUE: Standard myocardial SPECT imaging was performed after resting intravenous injection of 62 mCi Tc-73m tetrofosmin. Subsequently, intravenous infusion of Lexiscan was performed under the supervision of the Cardiology staff. At peak effect of the drug, 62 mCi Tc-49m tetrofosmin was  injected intravenously and standard myocardial SPECT imaging was performed. Quantitative gated imaging was also performed to evaluate left ventricular wall motion, and estimate left ventricular ejection fraction. COMPARISON:  None. FINDINGS: Perfusion: No decreased activity in the left ventricle on stress imaging to suggest reversible ischemia or infarction. Wall Motion: Normal left ventricular wall motion. No left ventricular dilation. Left Ventricular Ejection Fraction: 68 % End diastolic volume 71 ml End systolic volume 23 ml IMPRESSION: 1. No reversible ischemia or infarction. 2. Normal left ventricular wall motion. 3. Left ventricular ejection fraction 68% 4. Non invasive risk stratification*: Low risk *2012 Appropriate Use Criteria for Coronary Revascularization Focused Update: J Am Coll Cardiol. 2012;59(9):857-881. http://content.dementiazones.com.aspx?articleid=[email or web address] Electronically Signed   By: Rudie Meyer M.D.   On: 08/05/2017 15:16     Subjective: Feeling better, denies chest pain   Discharge Exam: Vitals:   08/05/17 1055 08/05/17 1140  BP: (!) 184/86 (!) 156/89  Pulse:  61  Resp:  18  Temp:  98.3 F (36.8 C)  SpO2:  99%   Vitals:   08/05/17 1051 08/05/17 1053 08/05/17 1055 08/05/17 1140  BP: (!) 201/85 (!) 186/85 (!) 184/86 (!) 156/89  Pulse:    61  Resp:    18  Temp:  98.3 F (36.8 C)  TempSrc:    Oral  SpO2:    99%  Weight:      Height:        General: Pt is alert, awake, not in acute distress Cardiovascular: RRR, S1/S2 +, no rubs, no gallops Respiratory: CTA bilaterally, no wheezing, no rhonchi Abdominal: Soft, NT, ND, bowel sounds + Extremities: no edema, no cyanosis    The results of significant diagnostics from this hospitalization (including imaging, microbiology, ancillary and laboratory) are listed below for reference.     Microbiology: No results found for this or any previous visit (from the past 240 hour(s)).   Labs: BNP (last 3  results) No results for input(s): BNP in the last 8760 hours. Basic Metabolic Panel: Recent Labs  Lab 08/03/17 1211 08/04/17 0452  NA 144 143  K 3.7 3.6  CL 107 106  CO2 29 27  GLUCOSE 79 117*  BUN 10 10  CREATININE 0.99 0.99  CALCIUM 9.5 9.2   Liver Function Tests: No results for input(s): AST, ALT, ALKPHOS, BILITOT, PROT, ALBUMIN in the last 168 hours. No results for input(s): LIPASE, AMYLASE in the last 168 hours. No results for input(s): AMMONIA in the last 168 hours. CBC: Recent Labs  Lab 08/03/17 1211  WBC 6.7  HGB 13.1  HCT 40.8  MCV 82.4  PLT 249   Cardiac Enzymes: Recent Labs  Lab 08/04/17 0900  TROPONINI <0.03   BNP: Invalid input(s): POCBNP CBG: Recent Labs  Lab 08/04/17 1150 08/04/17 1655 08/04/17 2107 08/05/17 0742 08/05/17 1140  GLUCAP 99 92 167* 126* 100*   D-Dimer No results for input(s): DDIMER in the last 72 hours. Hgb A1c No results for input(s): HGBA1C in the last 72 hours. Lipid Profile Recent Labs    08/04/17 0900  CHOL 159  HDL 57  LDLCALC 80  TRIG 109  CHOLHDL 2.8   Thyroid function studies No results for input(s): TSH, T4TOTAL, T3FREE, THYROIDAB in the last 72 hours.  Invalid input(s): FREET3 Anemia work up No results for input(s): VITAMINB12, FOLATE, FERRITIN, TIBC, IRON, RETICCTPCT in the last 72 hours. Urinalysis    Component Value Date/Time   COLORURINE STRAW (A) 08/03/2017 2009   APPEARANCEUR CLEAR 08/03/2017 2009   LABSPEC 1.004 (L) 08/03/2017 2009   PHURINE 6.0 08/03/2017 2009   GLUCOSEU NEGATIVE 08/03/2017 2009   HGBUR NEGATIVE 08/03/2017 2009   BILIRUBINUR NEGATIVE 08/03/2017 2009   KETONESUR NEGATIVE 08/03/2017 2009   PROTEINUR NEGATIVE 08/03/2017 2009   UROBILINOGEN 1.0 07/20/2013 1744   NITRITE NEGATIVE 08/03/2017 2009   LEUKOCYTESUR TRACE (A) 08/03/2017 2009   Sepsis Labs Invalid input(s): PROCALCITONIN,  WBC,  LACTICIDVEN Microbiology No results found for this or any previous visit (from the  past 240 hour(s)).   Time coordinating discharge: 35 minutes.  SIGNED:   Alba CoryBelkys A Jamaar Howes, MD  [facility] 08/05/2017, 3:51 PM Pager [phone]  If 7PM-7AM, please contact night-coverage [email or web address] Password [email or web address]

## 2017-08-10 DIAGNOSIS — R079 Chest pain, unspecified: Secondary | ICD-10-CM | POA: Diagnosis not present

## 2017-08-10 DIAGNOSIS — I10 Essential (primary) hypertension: Secondary | ICD-10-CM | POA: Diagnosis not present

## 2017-08-19 DIAGNOSIS — Z8739 Personal history of other diseases of the musculoskeletal system and connective tissue: Secondary | ICD-10-CM | POA: Diagnosis not present

## 2017-08-19 DIAGNOSIS — R0789 Other chest pain: Secondary | ICD-10-CM | POA: Diagnosis not present

## 2017-08-19 DIAGNOSIS — Z0189 Encounter for other specified special examinations: Secondary | ICD-10-CM | POA: Diagnosis not present

## 2017-08-19 DIAGNOSIS — I10 Essential (primary) hypertension: Secondary | ICD-10-CM | POA: Diagnosis not present

## 2017-09-02 ENCOUNTER — Encounter: Payer: Self-pay | Admitting: Cardiology

## 2017-09-03 DIAGNOSIS — I10 Essential (primary) hypertension: Secondary | ICD-10-CM | POA: Diagnosis not present

## 2017-09-06 DIAGNOSIS — F418 Other specified anxiety disorders: Secondary | ICD-10-CM | POA: Diagnosis not present

## 2017-09-15 DIAGNOSIS — E7849 Other hyperlipidemia: Secondary | ICD-10-CM | POA: Diagnosis not present

## 2017-09-15 DIAGNOSIS — I10 Essential (primary) hypertension: Secondary | ICD-10-CM | POA: Diagnosis not present

## 2017-09-15 DIAGNOSIS — Z6835 Body mass index (BMI) 35.0-35.9, adult: Secondary | ICD-10-CM | POA: Diagnosis not present

## 2017-09-15 DIAGNOSIS — E1165 Type 2 diabetes mellitus with hyperglycemia: Secondary | ICD-10-CM | POA: Diagnosis not present

## 2017-09-15 DIAGNOSIS — Z0189 Encounter for other specified special examinations: Secondary | ICD-10-CM | POA: Diagnosis not present

## 2017-09-15 DIAGNOSIS — R0789 Other chest pain: Secondary | ICD-10-CM | POA: Diagnosis not present

## 2017-09-15 DIAGNOSIS — Z8739 Personal history of other diseases of the musculoskeletal system and connective tissue: Secondary | ICD-10-CM | POA: Diagnosis not present

## 2017-09-24 ENCOUNTER — Ambulatory Visit: Payer: BLUE CROSS/BLUE SHIELD | Admitting: Cardiology

## 2017-11-08 ENCOUNTER — Encounter

## 2017-11-11 ENCOUNTER — Ambulatory Visit: Payer: BLUE CROSS/BLUE SHIELD | Admitting: Interventional Cardiology

## 2017-11-18 DIAGNOSIS — R350 Frequency of micturition: Secondary | ICD-10-CM | POA: Diagnosis not present

## 2017-11-18 DIAGNOSIS — Z113 Encounter for screening for infections with a predominantly sexual mode of transmission: Secondary | ICD-10-CM | POA: Diagnosis not present

## 2017-11-18 DIAGNOSIS — N760 Acute vaginitis: Secondary | ICD-10-CM | POA: Diagnosis not present

## 2017-11-18 DIAGNOSIS — Z6837 Body mass index (BMI) 37.0-37.9, adult: Secondary | ICD-10-CM | POA: Diagnosis not present

## 2017-12-13 DIAGNOSIS — E7849 Other hyperlipidemia: Secondary | ICD-10-CM | POA: Diagnosis not present

## 2017-12-13 DIAGNOSIS — Z23 Encounter for immunization: Secondary | ICD-10-CM | POA: Diagnosis not present

## 2017-12-13 DIAGNOSIS — I10 Essential (primary) hypertension: Secondary | ICD-10-CM | POA: Diagnosis not present

## 2017-12-13 DIAGNOSIS — E1165 Type 2 diabetes mellitus with hyperglycemia: Secondary | ICD-10-CM | POA: Diagnosis not present

## 2017-12-13 DIAGNOSIS — E119 Type 2 diabetes mellitus without complications: Secondary | ICD-10-CM | POA: Diagnosis not present

## 2018-01-04 DIAGNOSIS — Z124 Encounter for screening for malignant neoplasm of cervix: Secondary | ICD-10-CM | POA: Diagnosis not present

## 2018-01-04 DIAGNOSIS — Z01419 Encounter for gynecological examination (general) (routine) without abnormal findings: Secondary | ICD-10-CM | POA: Diagnosis not present

## 2018-01-04 DIAGNOSIS — Z6837 Body mass index (BMI) 37.0-37.9, adult: Secondary | ICD-10-CM | POA: Diagnosis not present

## 2018-01-04 DIAGNOSIS — N760 Acute vaginitis: Secondary | ICD-10-CM | POA: Diagnosis not present

## 2018-01-04 DIAGNOSIS — Z803 Family history of malignant neoplasm of breast: Secondary | ICD-10-CM | POA: Diagnosis not present

## 2018-01-04 DIAGNOSIS — Z806 Family history of leukemia: Secondary | ICD-10-CM | POA: Diagnosis not present

## 2018-02-07 DIAGNOSIS — Z1231 Encounter for screening mammogram for malignant neoplasm of breast: Secondary | ICD-10-CM | POA: Diagnosis not present

## 2018-02-07 DIAGNOSIS — Z6837 Body mass index (BMI) 37.0-37.9, adult: Secondary | ICD-10-CM | POA: Diagnosis not present

## 2018-03-16 DIAGNOSIS — E119 Type 2 diabetes mellitus without complications: Secondary | ICD-10-CM | POA: Diagnosis not present

## 2018-04-07 ENCOUNTER — Other Ambulatory Visit: Payer: Self-pay | Admitting: Cardiology

## 2018-06-16 DIAGNOSIS — E538 Deficiency of other specified B group vitamins: Secondary | ICD-10-CM | POA: Diagnosis not present

## 2018-06-16 DIAGNOSIS — E559 Vitamin D deficiency, unspecified: Secondary | ICD-10-CM | POA: Diagnosis not present

## 2018-06-16 DIAGNOSIS — E119 Type 2 diabetes mellitus without complications: Secondary | ICD-10-CM | POA: Diagnosis not present

## 2018-06-16 DIAGNOSIS — M109 Gout, unspecified: Secondary | ICD-10-CM | POA: Diagnosis not present

## 2018-06-16 DIAGNOSIS — Z0000 Encounter for general adult medical examination without abnormal findings: Secondary | ICD-10-CM | POA: Diagnosis not present

## 2018-06-16 DIAGNOSIS — E7849 Other hyperlipidemia: Secondary | ICD-10-CM | POA: Diagnosis not present

## 2018-06-23 DIAGNOSIS — E119 Type 2 diabetes mellitus without complications: Secondary | ICD-10-CM | POA: Diagnosis not present

## 2018-06-23 DIAGNOSIS — Z1331 Encounter for screening for depression: Secondary | ICD-10-CM | POA: Diagnosis not present

## 2018-06-23 DIAGNOSIS — J302 Other seasonal allergic rhinitis: Secondary | ICD-10-CM | POA: Diagnosis not present

## 2018-06-23 DIAGNOSIS — K5900 Constipation, unspecified: Secondary | ICD-10-CM | POA: Diagnosis not present

## 2018-06-23 DIAGNOSIS — Z0000 Encounter for general adult medical examination without abnormal findings: Secondary | ICD-10-CM | POA: Diagnosis not present

## 2018-06-23 DIAGNOSIS — I10 Essential (primary) hypertension: Secondary | ICD-10-CM | POA: Diagnosis not present

## 2018-06-30 DIAGNOSIS — I10 Essential (primary) hypertension: Secondary | ICD-10-CM | POA: Diagnosis not present

## 2018-06-30 DIAGNOSIS — R82998 Other abnormal findings in urine: Secondary | ICD-10-CM | POA: Diagnosis not present

## 2018-07-27 MED FILL — AMLODIPINE-VALSARTAN 10-160: 10-160 | 30 days supply | Qty: 30 | Fill #0

## 2018-08-01 MED FILL — FLUTICASONE PROP 50 MCG SPR: 50 | 60 days supply | Qty: 16 | Fill #0

## 2018-08-26 MED FILL — AMLODIPINE-VALSARTAN 10-160: 10-160 | 30 days supply | Qty: 30 | Fill #1

## 2018-09-22 DIAGNOSIS — R350 Frequency of micturition: Secondary | ICD-10-CM | POA: Diagnosis not present

## 2018-09-22 MED FILL — AMLODIPINE-VALSARTAN 10-160: 10-160 | 30 days supply | Qty: 30 | Fill #2

## 2018-10-21 MED FILL — AMLODIPINE-VALSARTAN 10-160: 10-160 | 30 days supply | Qty: 30 | Fill #3

## 2018-10-26 DIAGNOSIS — Z6839 Body mass index (BMI) 39.0-39.9, adult: Secondary | ICD-10-CM | POA: Diagnosis not present

## 2018-10-26 DIAGNOSIS — R102 Pelvic and perineal pain: Secondary | ICD-10-CM | POA: Diagnosis not present

## 2019-03-27 ENCOUNTER — Ambulatory Visit: Payer: 59 | Admitting: Podiatry

## 2019-07-12 ENCOUNTER — Other Ambulatory Visit: Payer: Self-pay | Admitting: Obstetrics and Gynecology

## 2019-07-12 DIAGNOSIS — R928 Other abnormal and inconclusive findings on diagnostic imaging of breast: Secondary | ICD-10-CM

## 2019-07-26 ENCOUNTER — Other Ambulatory Visit: Payer: BLUE CROSS/BLUE SHIELD

## 2019-08-23 ENCOUNTER — Ambulatory Visit (INDEPENDENT_AMBULATORY_CARE_PROVIDER_SITE_OTHER): Payer: 59 | Admitting: Otolaryngology

## 2020-01-25 ENCOUNTER — Other Ambulatory Visit: Payer: 59

## 2020-01-25 DIAGNOSIS — Z20822 Contact with and (suspected) exposure to covid-19: Secondary | ICD-10-CM

## 2020-01-31 LAB — NOVEL CORONAVIRUS, NAA: SARS-CoV-2, NAA: NOT DETECTED

## 2020-05-16 DIAGNOSIS — H900 Conductive hearing loss, bilateral: Secondary | ICD-10-CM | POA: Insufficient documentation

## 2020-05-16 DIAGNOSIS — J302 Other seasonal allergic rhinitis: Secondary | ICD-10-CM | POA: Insufficient documentation

## 2020-05-16 DIAGNOSIS — H6123 Impacted cerumen, bilateral: Secondary | ICD-10-CM | POA: Insufficient documentation

## 2020-06-05 ENCOUNTER — Ambulatory Visit (INDEPENDENT_AMBULATORY_CARE_PROVIDER_SITE_OTHER): Payer: Medicare Other | Admitting: Otolaryngology

## 2020-07-10 ENCOUNTER — Other Ambulatory Visit: Payer: Self-pay

## 2020-07-10 ENCOUNTER — Ambulatory Visit (INDEPENDENT_AMBULATORY_CARE_PROVIDER_SITE_OTHER): Payer: Medicare Other | Admitting: Otolaryngology

## 2020-07-10 DIAGNOSIS — H9313 Tinnitus, bilateral: Secondary | ICD-10-CM

## 2020-07-10 DIAGNOSIS — H9113 Presbycusis, bilateral: Secondary | ICD-10-CM

## 2020-07-10 NOTE — Progress Notes (Signed)
HPI: Dorothy Torres is a 65 y.o. female who presents for evaluation of sounds in her ears.  This began about 6 weeks ago with blocked hearing in the left ear initially.  She did develop blocked hearing in the right ear.  She had her ears cleaned a couple weeks ago and the hearing got better but she still hears intermittent sounds in her ears with fluctuating hearing in her ears that is worse at night. She has been exposed to some loud noise.  She complains about the noise from her neighbors. She has tried some over-the-counter medication for tinnitus I believe is Lipo flavonoid without much benefit.  Past Medical History:  Diagnosis Date   Allergy    Anxiety    Asthma    Constipation    hard to pass her stool    Diabetes mellitus without complication (HCC)    Hyperlipidemia    Hypertension    Obesity    Sleep difficulties    Past Surgical History:  Procedure Laterality Date   COLONOSCOPY     KNEE ARTHROSCOPY     POLYPECTOMY     trauma lower abdomen     Social History   Socioeconomic History   Marital status: Single    Spouse name: Not on file   Number of children: 1   Years of education: college   Highest education level: Not on file  Occupational History   Not on file  Tobacco Use   Smoking status: Never   Smokeless tobacco: Never  Vaping Use   Vaping Use: Never used  Substance and Sexual Activity   Alcohol use: No    Alcohol/week: 0.0 standard drinks   Drug use: No   Sexual activity: Not on file  Other Topics Concern   Not on file  Social History Narrative   Patient lives at home alone. Patient is single.   Patient works part time.   Education BA. College.   Right handed.   Caffeine two cups of coffee daily.   Social Determinants of Health   Financial Resource Strain: Not on file  Food Insecurity: Not on file  Transportation Needs: Not on file  Physical Activity: Not on file  Stress: Not on file  Social Connections: Not on file   Family History   Problem Relation Age of Onset   High blood pressure Mother    Diabetes Mother    Kidney disease Mother    Colon polyps Mother    Heart attack Father    High blood pressure Father    Colon polyps Other        cousins x multiple - maternal side    Breast cancer Paternal Grandmother    Colon cancer Neg Hx    Rectal cancer Neg Hx    Stomach cancer Neg Hx    Allergies  Allergen Reactions   Statins Other (See Comments)    Leg pain   Prior to Admission medications   Medication Sig Start Date End Date Taking? Authorizing Provider  allopurinol (ZYLOPRIM) 100 MG tablet Take 300 mg by mouth daily. 06/07/17   [provider]  amLODipine (NORVASC) 5 MG tablet Take 1 tablet (5 mg total) by mouth daily. 08/05/17   Regalado, Jon Billings A, MD  aspirin EC 81 MG EC tablet Take 1 tablet (81 mg total) by mouth daily. 08/06/17   Regalado, Belkys A, MD  Cholecalciferol (VITAMIN D-3) 1000 units CAPS Take 1,000 Units by mouth daily.    [provider]  hydrochlorothiazide (HYDRODIURIL) 25 MG tablet Take 1 tablet (25 mg total) by mouth daily. 08/06/17   Regalado, Belkys A, MD  lisinopril (PRINIVIL,ZESTRIL) 20 MG tablet Take 1 tablet (20 mg total) by mouth daily. 08/06/17   Regalado, Belkys A, MD  metFORMIN (GLUCOPHAGE) 500 MG tablet Take 500 mg by mouth 2 (two) times daily with a meal.    [provider]  Multiple Vitamins-Calcium (ONE-A-DAY WOMENS PO) Take 1 tablet by mouth daily.     [provider]  rosuvastatin (CRESTOR) 5 MG tablet Take 5 mg by mouth every Monday, Wednesday, and Friday. 06/15/17   [provider]  VITAMIN K PO Take 1 tablet by mouth daily.    [provider]     Positive ROS: Otherwise negative  All other systems have been reviewed and were otherwise negative with the exception of those mentioned in the HPI and as above.  Physical Exam: Constitutional: Alert, well-appearing, no acute distress Ears: External ears without lesions or  tenderness. Ear canals are clear bilaterally with intact, clear TMs.  Nasal: External nose without lesions. Septum with mild deformity and mild rhinitis. Clear nasal passages Oral: Lips and gums without lesions. Tongue and palate mucosa without lesions. Posterior oropharynx clear. Neck: No palpable adenopathy or masses Respiratory: Breathing comfortably  Skin: No facial/neck lesions or rash noted.  Audiologic testing in the office today demonstrated normal hearing in the lower frequencies however in the upper frequencies above 3000 she has a mild bilateral sensorineural hearing loss consistent with presbycusis.  SRT's were 10 DB bilaterally with type A tympanograms bilaterally.  Procedures  Assessment: Bilateral tinnitus secondary to high-frequency sensorineural hearing loss  Plan: Discussed with her concerning using masking noise to help when the tinnitus is bad.  She is already tried Lipo flavonoid. Also discussed with her concerning using ear protection when around loud noise. She will follow-up as needed.  Narda Bonds, MD

## 2020-07-18 ENCOUNTER — Other Ambulatory Visit: Payer: Self-pay | Admitting: Obstetrics and Gynecology

## 2020-07-18 DIAGNOSIS — Z1231 Encounter for screening mammogram for malignant neoplasm of breast: Secondary | ICD-10-CM

## 2020-07-19 ENCOUNTER — Other Ambulatory Visit: Payer: Self-pay | Admitting: Internal Medicine

## 2020-07-19 DIAGNOSIS — R928 Other abnormal and inconclusive findings on diagnostic imaging of breast: Secondary | ICD-10-CM

## 2020-07-19 DIAGNOSIS — Z1231 Encounter for screening mammogram for malignant neoplasm of breast: Secondary | ICD-10-CM

## 2020-07-23 ENCOUNTER — Other Ambulatory Visit: Payer: Self-pay | Admitting: Obstetrics and Gynecology

## 2020-07-23 DIAGNOSIS — R928 Other abnormal and inconclusive findings on diagnostic imaging of breast: Secondary | ICD-10-CM

## 2020-07-26 ENCOUNTER — Ambulatory Visit
Admission: RE | Admit: 2020-07-26 | Discharge: 2020-07-26 | Disposition: A | Discharge status: Home / Self Care | Payer: Medicare Other | Source: Ambulatory Visit | Attending: Internal Medicine | Admitting: Internal Medicine

## 2020-07-26 ENCOUNTER — Other Ambulatory Visit: Payer: Self-pay

## 2020-07-26 DIAGNOSIS — R928 Other abnormal and inconclusive findings on diagnostic imaging of breast: Secondary | ICD-10-CM

## 2020-09-11 ENCOUNTER — Ambulatory Visit: Payer: Medicare Other

## 2020-09-24 ENCOUNTER — Encounter: Payer: Self-pay | Admitting: Gastroenterology

## 2020-10-24 ENCOUNTER — Encounter (INDEPENDENT_AMBULATORY_CARE_PROVIDER_SITE_OTHER): Payer: Self-pay

## 2020-11-06 ENCOUNTER — Encounter: Payer: Self-pay | Admitting: Gastroenterology

## 2021-01-16 ENCOUNTER — Encounter: Payer: Medicare Other | Admitting: Gastroenterology

## 2021-03-27 DIAGNOSIS — M25551 Pain in right hip: Secondary | ICD-10-CM | POA: Insufficient documentation

## 2021-06-11 ENCOUNTER — Ambulatory Visit (INDEPENDENT_AMBULATORY_CARE_PROVIDER_SITE_OTHER): Payer: Medicare Other | Admitting: Podiatry

## 2021-06-11 ENCOUNTER — Ambulatory Visit (INDEPENDENT_AMBULATORY_CARE_PROVIDER_SITE_OTHER): Payer: Medicare Other

## 2021-06-11 DIAGNOSIS — M79671 Pain in right foot: Secondary | ICD-10-CM

## 2021-06-11 DIAGNOSIS — M778 Other enthesopathies, not elsewhere classified: Secondary | ICD-10-CM

## 2021-06-11 DIAGNOSIS — M7751 Other enthesopathy of right foot: Secondary | ICD-10-CM

## 2021-06-11 MED ORDER — BETAMETHASONE SOD PHOS & ACET 6 (3-3) MG/ML IJ SUSP
3.0000 mg | Freq: Once | INTRAMUSCULAR | Status: AC
  Administered 2021-06-11: 3 mg via INTRA_ARTICULAR

## 2021-06-11 NOTE — Progress Notes (Signed)
   Subjective:  66 y.o. female presenting today as a reestablish new patient for 2 separate complaints.  Patient states that about 2 weeks ago she stubbed her right fifth toe at her home.  It has been painful and sensitive ever since.  She also states that she has been dealing with some chronic ankle pain with some slight swelling.  She denies a history of injury to the ankle.  She presents for further treatment and evaluation   Past Medical History:  Diagnosis Date   Allergy    Anxiety    Asthma    Constipation    hard to pass her stool    Diabetes mellitus without complication (HCC)    Hyperlipidemia    Hypertension    Obesity    Sleep difficulties    Past Surgical History:  Procedure Laterality Date   COLONOSCOPY     KNEE ARTHROSCOPY     POLYPECTOMY     trauma lower abdomen     Allergies  Allergen Reactions   Statins Other (See Comments)    Leg pain   Objective / Physical Exam:  General:  The patient is alert and oriented x3 in no acute distress. Dermatology:  Skin is warm, dry and supple bilateral lower extremities. Negative for open lesions or macerations. Vascular:  Palpable pedal pulses bilaterally.  There is some very mild edema noted to the right ankle compared to the contralateral limb. Capillary refill within normal limits. Neurological:  Epicritic and protective threshold grossly intact bilaterally.  Musculoskeletal Exam:  Pain on palpation to the anterior lateral medial aspects of the patient's right ankle. Mild edema noted. Range of motion within normal limits to all pedal and ankle joints bilateral.  There is also associated tenderness with light touch to the right fifth toe secondary to the stubbing injury about 2 weeks ago.  Muscle strength 5/5 in all groups bilateral.   Radiographic Exam RT foot and ankle 06/11/2021:  Normal osseous mineralization. Joint spaces preserved. No fracture/dislocation/boney destruction.  No fractures identified specifically to  the right fifth digit  Assessment: 1.  Capsulitis right ankle 2.  Stubbing injury/capsulitis right fifth toe  Plan of Care:  1. Patient was evaluated. X-Rays reviewed.  2. Injection of 0.5 mL Celestone Soluspan injected in the patient's right ankle. 3.  Postsurgical shoe dispensed to immobilize the right fifth toe 4.  Patient has prescription for meloxicam 15 mg at home.  Take daily 5.  Return to clinic 4 weeks  *Advertising account planner.    Felecia Shelling, DPM [facility]  Dr. Felecia Shelling, DPM    [address] [place], [place] [address]                Office [phone]  Fax [phone]

## 2021-06-18 ENCOUNTER — Ambulatory Visit: Payer: Medicare Other | Admitting: Podiatry

## 2021-06-18 ENCOUNTER — Other Ambulatory Visit: Payer: Self-pay | Admitting: Podiatry

## 2021-06-18 DIAGNOSIS — M778 Other enthesopathies, not elsewhere classified: Secondary | ICD-10-CM

## 2021-07-09 ENCOUNTER — Ambulatory Visit (INDEPENDENT_AMBULATORY_CARE_PROVIDER_SITE_OTHER): Payer: Medicare Other | Admitting: Podiatry

## 2021-07-09 DIAGNOSIS — M7751 Other enthesopathy of right foot: Secondary | ICD-10-CM

## 2021-07-09 MED ORDER — BETAMETHASONE SOD PHOS & ACET 6 (3-3) MG/ML IJ SUSP
3.0000 mg | Freq: Once | INTRAMUSCULAR | Status: AC
  Administered 2021-07-09: 3 mg via INTRA_ARTICULAR

## 2021-07-09 NOTE — Progress Notes (Signed)
   Subjective:  66 y.o. female presenting today for follow-up evaluation of right ankle pain as well as an injury to the right fifth toe.  Patient states that overall there has been some improvement.  Her right ankle is actually feeling much better.  She presents for further treatment and evaluation   Past Medical History:  Diagnosis Date   Allergy    Anxiety    Asthma    Constipation    hard to pass her stool    Diabetes mellitus without complication (HCC)    Hyperlipidemia    Hypertension    Obesity    Sleep difficulties    Past Surgical History:  Procedure Laterality Date   COLONOSCOPY     KNEE ARTHROSCOPY     POLYPECTOMY     trauma lower abdomen     Allergies  Allergen Reactions   Statins Other (See Comments)    Leg pain   Objective / Physical Exam:  General:  The patient is alert and oriented x3 in no acute distress. Dermatology:  Skin is warm, dry and supple bilateral lower extremities. Negative for open lesions or macerations. Vascular:  Palpable pedal pulses bilaterally.  There is some very mild edema noted to the right ankle compared to the contralateral limb. Capillary refill within normal limits. Neurological:  Epicritic and protective threshold grossly intact bilaterally.  Musculoskeletal Exam:  There continues to be some residual pain on palpation to the anterior lateral medial aspects of the patient's right ankle.  Edema improved. Range of motion within normal limits to all pedal and ankle joints bilateral.  Also continues to be some slight associated tenderness with light touch to the right fifth toe secondary to the stubbing injury.  Muscle strength 5/5 in all groups bilateral.   Radiographic Exam RT foot and ankle 06/11/2021:  Normal osseous mineralization. Joint spaces preserved. No fracture/dislocation/boney destruction.  No fractures identified specifically to the right fifth digit  Assessment: 1.  Capsulitis right ankle 2.  Stubbing injury/capsulitis  right fifth toe  Plan of Care:  1. Patient was evaluated.   2. Injection of 0.5 mL Celestone Soluspan injected in the patient's right ankle. 3.  Continue for meloxicam 15 mg daily as needed 4.  Patient may begin to transition out of the postsurgical shoe into good supportive sneakers as tolerated 5.  Return to clinic as needed  [employer].    Felecia Shelling, DPM [facility]  Dr. Felecia Shelling, DPM    [address] [place], [place] [address]                Office [phone]  Fax [phone]

## 2021-12-02 ENCOUNTER — Emergency Department (HOSPITAL_BASED_OUTPATIENT_CLINIC_OR_DEPARTMENT_OTHER): Payer: Medicare Other | Admitting: Radiology

## 2021-12-02 ENCOUNTER — Encounter (HOSPITAL_BASED_OUTPATIENT_CLINIC_OR_DEPARTMENT_OTHER): Payer: Self-pay

## 2021-12-02 ENCOUNTER — Other Ambulatory Visit: Payer: Self-pay

## 2021-12-02 ENCOUNTER — Emergency Department (HOSPITAL_BASED_OUTPATIENT_CLINIC_OR_DEPARTMENT_OTHER)
Admission: EM | Admit: 2021-12-02 | Discharge: 2021-12-02 | Disposition: A | Discharge status: Home / Self Care | Payer: Medicare Other | Attending: Emergency Medicine | Admitting: Emergency Medicine

## 2021-12-02 DIAGNOSIS — Z79899 Other long term (current) drug therapy: Secondary | ICD-10-CM | POA: Diagnosis not present

## 2021-12-02 DIAGNOSIS — Z7982 Long term (current) use of aspirin: Secondary | ICD-10-CM | POA: Diagnosis not present

## 2021-12-02 DIAGNOSIS — E119 Type 2 diabetes mellitus without complications: Secondary | ICD-10-CM | POA: Diagnosis not present

## 2021-12-02 DIAGNOSIS — R0789 Other chest pain: Secondary | ICD-10-CM | POA: Diagnosis not present

## 2021-12-02 DIAGNOSIS — J45909 Unspecified asthma, uncomplicated: Secondary | ICD-10-CM | POA: Diagnosis not present

## 2021-12-02 DIAGNOSIS — I10 Essential (primary) hypertension: Secondary | ICD-10-CM | POA: Insufficient documentation

## 2021-12-02 DIAGNOSIS — Z7984 Long term (current) use of oral hypoglycemic drugs: Secondary | ICD-10-CM | POA: Insufficient documentation

## 2021-12-02 DIAGNOSIS — R079 Chest pain, unspecified: Secondary | ICD-10-CM | POA: Diagnosis present

## 2021-12-02 LAB — BASIC METABOLIC PANEL
Anion gap: 12 (ref 5–15)
BUN: 13 mg/dL (ref 8–23)
CO2: 25 mmol/L (ref 22–32)
Calcium: 10.1 mg/dL (ref 8.9–10.3)
Chloride: 103 mmol/L (ref 98–111)
Creatinine, Ser: 0.88 mg/dL (ref 0.44–1.00)
GFR, Estimated: >60 mL/min (ref >60)
Glucose, Bld: 247 mg/dL — ABNORMAL HIGH (ref 70–99)
Potassium: 4 mmol/L (ref 3.5–5.1)
Sodium: 140 mmol/L (ref 135–145)

## 2021-12-02 LAB — CBC
HCT: 39.1 % (ref 36.0–46.0)
Hemoglobin: 13 g/dL (ref 12.0–15.0)
MCH: 26.6 pg (ref 26.0–34.0)
MCHC: 33.2 g/dL (ref 30.0–36.0)
MCV: 80.1 fL (ref 80.0–100.0)
Platelets: 252 10*3/uL (ref 150–400)
RBC: 4.88 MIL/uL (ref 3.87–5.11)
RDW: 14 % (ref 11.5–15.5)
WBC: 7 10*3/uL (ref 4.0–10.5)
nRBC: 0 % (ref 0.0–0.2)

## 2021-12-02 LAB — TROPONIN I (HIGH SENSITIVITY): Troponin I (High Sensitivity): <2 ng/L (ref <18)

## 2021-12-02 NOTE — Discharge Instructions (Signed)
We evaluated you for your chest pain.  Your cardiac enzymes were negative.  Your EKG did not show signs of an acute heart attack.  Please follow-up closely with your primary doctor as you should likely have a stress test to further evaluate your chest pain.  Please return if your pain worsens, you develop any nausea or vomiting, sweating, fainting, difficulty breathing, or any other concerning symptoms.

## 2021-12-02 NOTE — ED Provider Notes (Signed)
[facility] EMERGENCY DEPT Provider Note  CSN: [id number] Arrival date & time: 12/02/21 1739  Chief Complaint(s) Chest Pain  HPI Dorothy Torres is a 66 y.o. female with history of diabetes, hyperlipidemia, hypertension presenting to the emergency department with chest pain.  Patient reports chest pain for 3 days.  She reports pain has no modifying factors, not exertional, not pleuritic.  She reports mild shortness of breath when she has pain.  She denies any recent travel, surgeries.  She denies any history of blood clots.  She denies any nausea, vomiting, diaphoresis, cough, fevers or chills, lightheadedness, dizziness, fainting.  Symptoms are mild.  She reports significant stress recently   Past Medical History Past Medical History:  Diagnosis Date   Allergy    Anxiety    Asthma    Constipation    hard to pass her stool    Diabetes mellitus without complication (HCC)    Hyperlipidemia    Hypertension    Obesity    Sleep difficulties    Patient Active Problem List   Diagnosis Date Noted   Hypertension 08/03/2017   Chest pain 08/03/2017   Diabetes mellitus without complication (HCC) 08/03/2017   Hypertensive urgency 08/03/2017   Sleep difficulties    Home Medication(s) Prior to Admission medications   Medication Sig Start Date End Date Taking? Authorizing Provider  allopurinol (ZYLOPRIM) 100 MG tablet Take 300 mg by mouth daily. 06/07/17   [provider]  amLODipine (NORVASC) 5 MG tablet Take 1 tablet (5 mg total) by mouth daily. 08/05/17   Regalado, Jon Billings A, MD  aspirin EC 81 MG EC tablet Take 1 tablet (81 mg total) by mouth daily. 08/06/17   Regalado, Belkys A, MD  Cholecalciferol (VITAMIN D-3) 1000 units CAPS Take 1,000 Units by mouth daily.    [provider]  hydrochlorothiazide (HYDRODIURIL) 25 MG tablet Take 1 tablet (25 mg total) by mouth daily. 08/06/17   Regalado, Belkys A, MD  lisinopril (PRINIVIL,ZESTRIL) 20 MG tablet Take 1 tablet  (20 mg total) by mouth daily. 08/06/17   Regalado, Belkys A, MD  metFORMIN (GLUCOPHAGE) 500 MG tablet Take 500 mg by mouth 2 (two) times daily with a meal.    [provider]  Multiple Vitamins-Calcium (ONE-A-DAY WOMENS PO) Take 1 tablet by mouth daily.     [provider]  rosuvastatin (CRESTOR) 5 MG tablet Take 5 mg by mouth every Monday, Wednesday, and Friday. 06/15/17   [provider]  VITAMIN K PO Take 1 tablet by mouth daily.    [provider]                                                                                                                                    Past Surgical History Past Surgical History:  Procedure Laterality Date   COLONOSCOPY     KNEE ARTHROSCOPY     POLYPECTOMY     trauma lower  abdomen     Family History Family History  Problem Relation Age of Onset   High blood pressure Mother    Diabetes Mother    Kidney disease Mother    Colon polyps Mother    Heart attack Father    High blood pressure Father    Colon polyps Other        cousins x multiple - maternal side    Breast cancer Paternal Grandmother    Colon cancer Neg Hx    Rectal cancer Neg Hx    Stomach cancer Neg Hx     Social History Social History   Tobacco Use   Smoking status: Never   Smokeless tobacco: Never  Vaping Use   Vaping Use: Never used  Substance Use Topics   Alcohol use: No    Alcohol/week: 0.0 standard drinks of alcohol   Drug use: No   Allergies Statins  Review of Systems Review of Systems  All other systems reviewed and are negative.   Physical Exam Vital Signs  I have reviewed the triage vital signs BP (!) 154/56   Pulse (!) 59   Temp (!) 97.2 F (36.2 C) (Oral)   Resp 18   Ht 5\' 1"  (1.549 m)   Wt 88 kg   SpO2 100%   BMI 36.66 kg/m  Physical Exam Vitals and nursing note reviewed.  Constitutional:      General: She is not in acute distress.    Appearance: She is well-developed.  HENT:     Head:  Normocephalic and atraumatic.     Mouth/Throat:     Mouth: Mucous membranes are moist.  Eyes:     Pupils: Pupils are equal, round, and reactive to light.  Cardiovascular:     Rate and Rhythm: Normal rate and regular rhythm.     Heart sounds: No murmur heard. Pulmonary:     Effort: Pulmonary effort is normal. No respiratory distress.     Breath sounds: Normal breath sounds.  Abdominal:     General: Abdomen is flat.     Palpations: Abdomen is soft.     Tenderness: There is no abdominal tenderness.  Musculoskeletal:        General: No tenderness.     Right lower leg: No edema.     Left lower leg: No edema.  Skin:    General: Skin is warm and dry.  Neurological:     General: No focal deficit present.     Mental Status: She is alert. Mental status is at baseline.  Psychiatric:        Mood and Affect: Mood normal.        Behavior: Behavior normal.     ED Results and Treatments Labs (all labs ordered are listed, but only abnormal results are displayed) Labs Reviewed  BASIC METABOLIC PANEL - Abnormal; Notable for the following components:      Result Value   Glucose, Bld 247 (*)    All other components within normal limits  CBC  TROPONIN I (HIGH SENSITIVITY)  Radiology DG Chest 2 View  Result Date: 12/02/2021 CLINICAL DATA:  Chest pain EXAM: CHEST - 2 VIEW COMPARISON:  Chest 08/03/2017 FINDINGS: The heart size and mediastinal contours are within normal limits. Both lungs are clear. The visualized skeletal structures are unremarkable. IMPRESSION: No active cardiopulmonary disease. Electronically Signed   By: Marlan Palau M.D.   On: 12/02/2021 18:47    Pertinent labs & imaging results that were available during my care of the patient were reviewed by me and considered in my medical decision making (see MDM for details).  Medications Ordered in  ED Medications - No data to display                                                                                                                                   Procedures .1-3 Lead EKG Interpretation  Performed by: Lonell Grandchild, MD Authorized by: Lonell Grandchild, MD     Interpretation: abnormal     ECG rate:  65   ECG rate assessment: normal     Rhythm: sinus rhythm     Ectopy: PAC     Conduction: normal     (including critical care time)  Medical Decision Making / ED Course   MDM:  66 year old female presenting to the emergency department with chest pain.  Patient overall well-appearing, physical exam is overall reassuring.  EKG demonstrates frequent premature atrial complexes with some nonspecific T wave changes since 2019.  No STEMI.  Low concern for ACS.  Although patient has some nonspecific T wave changes, symptoms atypical for ACS, her high-sensitivity troponin is negative.  Discussed with patient that she should follow-up with her primary doctor as she may need repeat stress test as her last was 2019.  Doubt pulmonary embolism with no tachycardia, tachypnea, hypoxia, risk factors.  Chest x-ray without evidence of pneumothorax, pneumonia.  No nausea or vomiting to suggest esophageal pathology.  Doubt aortic pathology given mild pain for 3 days, no mediastinal widening on chest x-ray, no sudden onset pain.  Patient's pain may be somewhat related to stress that she reports significant recent stress. Will discharge patient to home. All questions answered. Patient comfortable with plan of discharge. Return precautions discussed with patient and specified on the after visit summary.       Additional history obtained:  -External records from outside source obtained and reviewed including: Chart review including previous notes, labs, imaging, consultation notes including 2019 stress test   Lab Tests: -I ordered, reviewed, and interpreted labs.   The pertinent  results include:   Labs Reviewed  BASIC METABOLIC PANEL - Abnormal; Notable for the following components:      Result Value   Glucose, Bld 247 (*)    All other components within normal limits  CBC  TROPONIN I (HIGH SENSITIVITY)    Notable for negative HStroponin  EKG   EKG Interpretation  Date/Time:  Tuesday December 02 2021 17:48:19  EST Ventricular Rate:  63 PR Interval:  140 QRS Duration: 74 QT Interval:  392 QTC Calculation: 401 R Axis:   16 Text Interpretation: Sinus rhythm with Premature atrial complexes Nonspecific T wave abnormality Abnormal ECG When compared with ECG of 05-Aug-2017 06:17, Premature atrial complexes are now Present Nonspecific T wave abnormality now evident in Anterolateral leads Confirmed by Alvino BloodScheving, Almyra Birman ([id number]) on 12/02/2021 7:12:06 PM         Imaging Studies ordered: I ordered imaging studies including CXR On my interpretation imaging demonstrates no acute process I independently visualized and interpreted imaging. I agree with the radiologist interpretation   Medicines ordered and prescription drug management: No orders of the defined types were placed in this encounter.   -I have reviewed the patients home medicines and have made adjustments as needed   Cardiac Monitoring: The patient was maintained on a cardiac monitor.  I personally viewed and interpreted the cardiac monitored which showed an underlying rhythm of: sinus with PAC   Co morbidities that complicate the patient evaluation  Past Medical History:  Diagnosis Date   Allergy    Anxiety    Asthma    Constipation    hard to pass her stool    Diabetes mellitus without complication (HCC)    Hyperlipidemia    Hypertension    Obesity    Sleep difficulties       Dispostion: Disposition decision including need for hospitalization was considered, and patient discharged from emergency department.    Final Clinical Impression(s) / ED Diagnoses Final diagnoses:   Atypical chest pain     This chart was dictated using voice recognition software.  Despite best efforts to proofread,  errors can occur which can change the documentation meaning.    Lonell GrandchildScheving, Katheleen Stella L, MD 12/02/21 1925

## 2021-12-02 NOTE — ED Notes (Signed)
Reviewed AVS/discharge instruction with patient. Time allotted for and all questions answered. Patient is agreeable for d/c and escorted to ed exit by staff.  

## 2021-12-02 NOTE — ED Triage Notes (Signed)
Patient here POV from Home.  Endorses CP and SOB for approximately 2-3 Days. States she has been under stress the past week related to family matters. History of Asthma. Inhaler Use without Much Relief.   No Fevers. No N/V.   NAD Noted during Triage. A&Ox4. GCS 15. Ambulatory.

## 2022-03-03 ENCOUNTER — Other Ambulatory Visit: Payer: Self-pay | Admitting: Internal Medicine

## 2022-03-03 DIAGNOSIS — R0789 Other chest pain: Secondary | ICD-10-CM

## 2022-04-01 ENCOUNTER — Other Ambulatory Visit: Payer: Medicare Other

## 2022-04-19 IMAGING — MG DIGITAL DIAGNOSTIC BILAT W/ TOMO W/ CAD
8 of 15 series · 8 of 40 positions shown · non-contrast
Comparison: Previous exam(s).

CLINICAL DATA: Patient for delayed evaluation left breast asymmetry
recall.

EXAM:
DIGITAL DIAGNOSTIC BILATERAL MAMMOGRAM WITH TOMOSYNTHESIS AND CAD;
ULTRASOUND LEFT BREAST LIMITED
TECHNIQUE: Bilateral digital diagnostic mammography and breast tomosynthesis
was performed. The images were evaluated with computer-aided
detection.; Targeted ultrasound examination of the left breast was
performed

[R CC synth-2D]
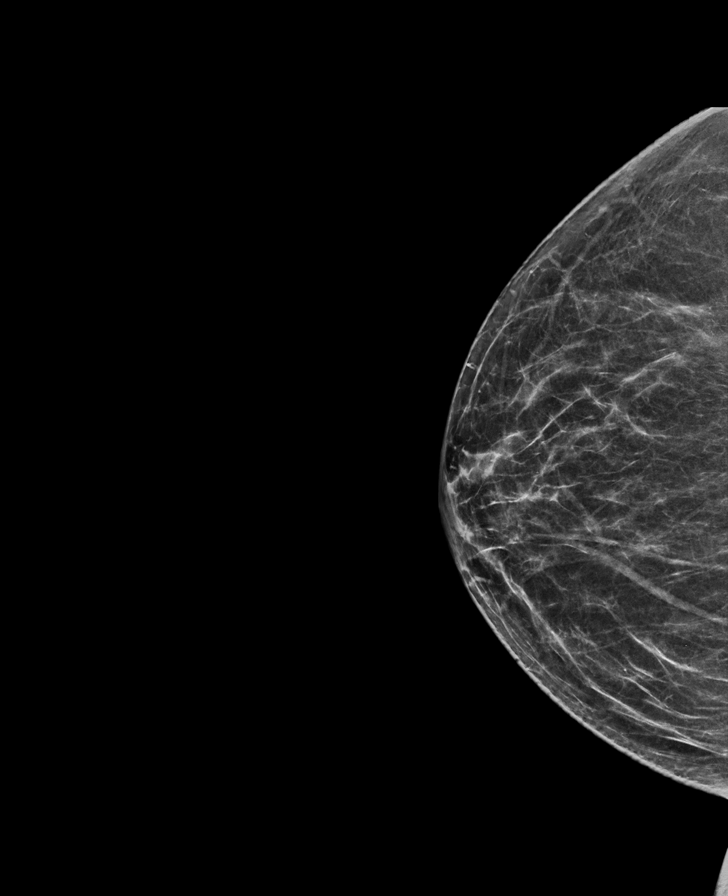

[R MLO synth-2D]
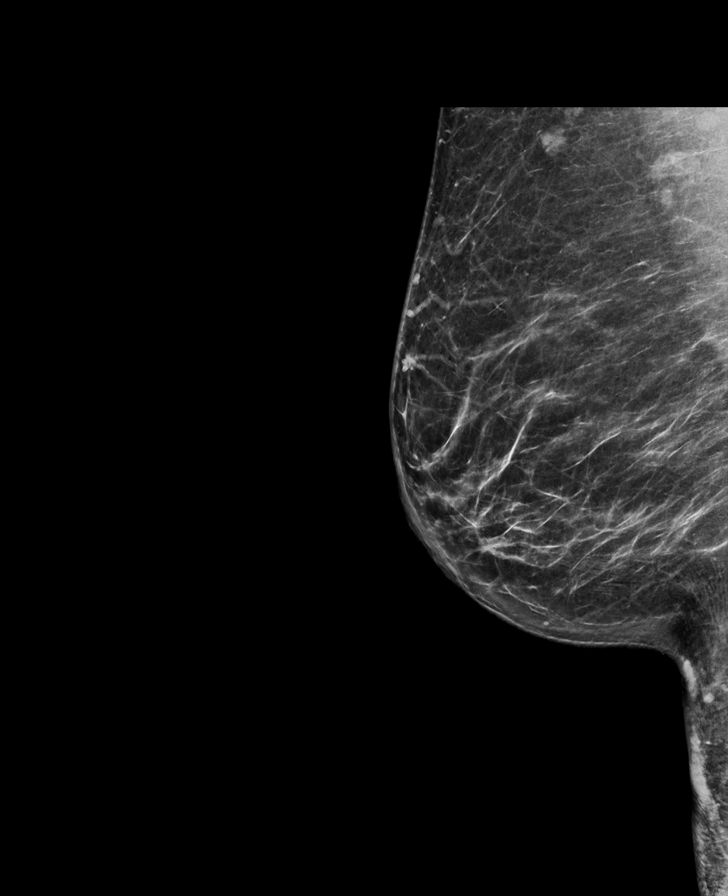

[L ML synth-2D]
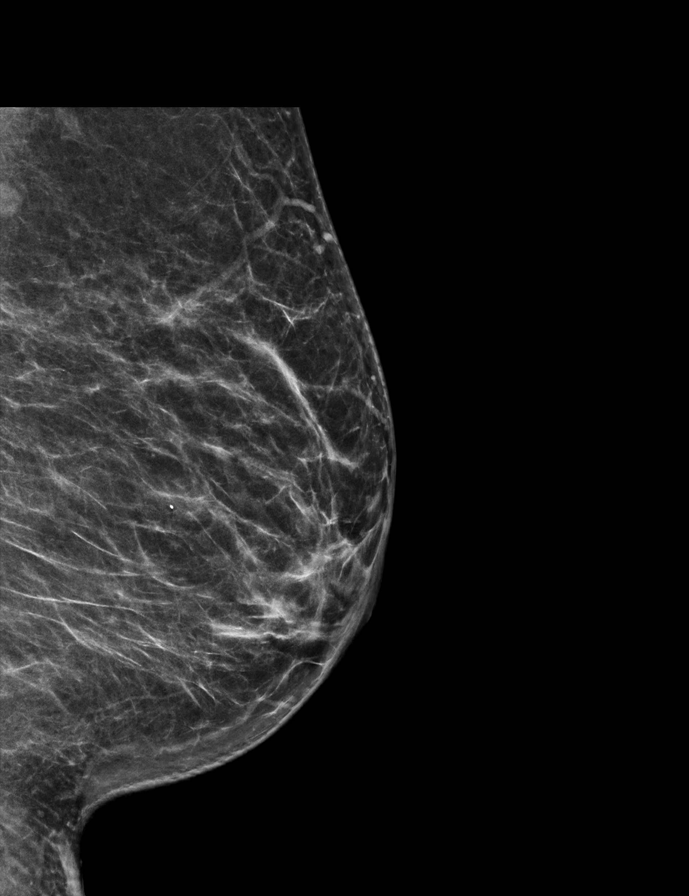

[L CC synth-2D (1 of 2)]
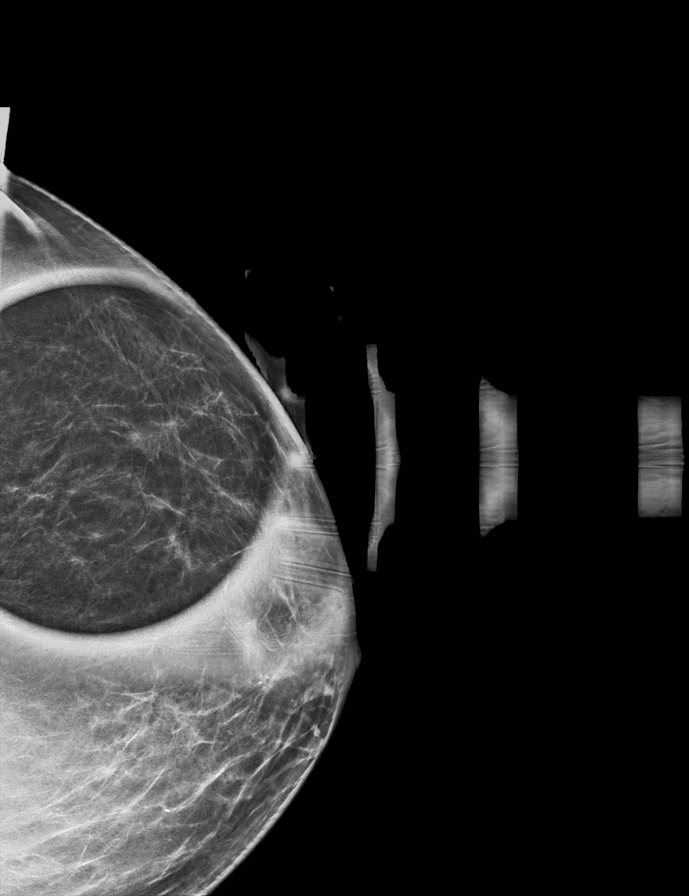

[L MLO synth-2D (1 of 2)]
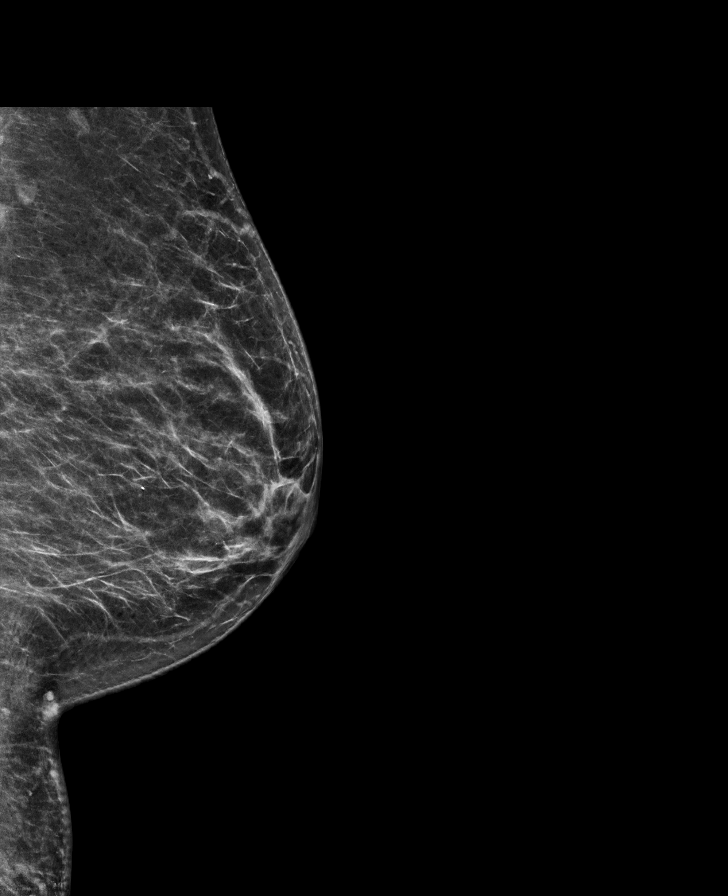

[L CC synth-2D (2 of 2)]
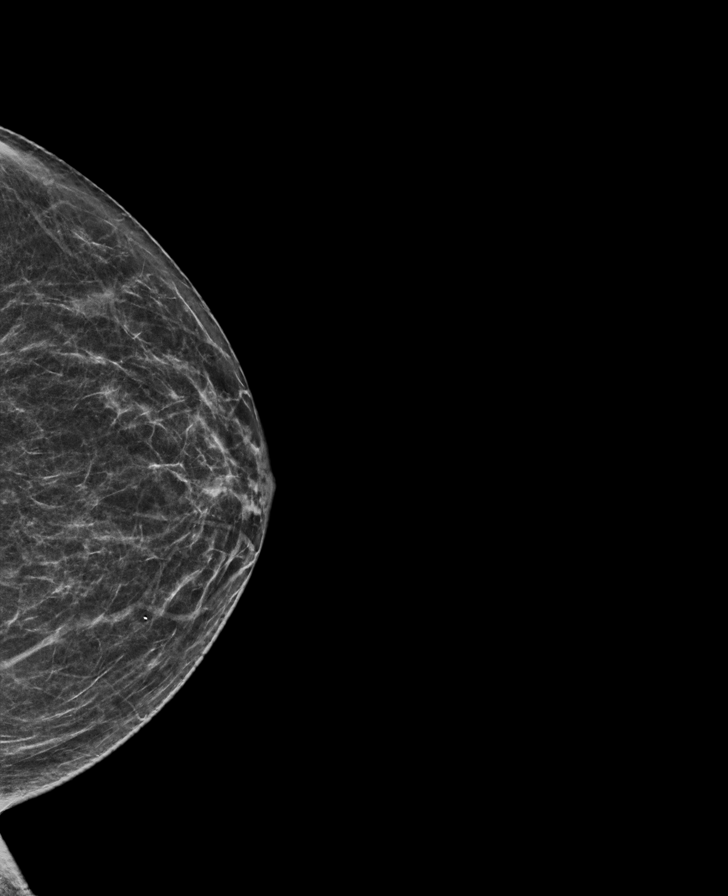

[L MLO synth-2D (2 of 2)]
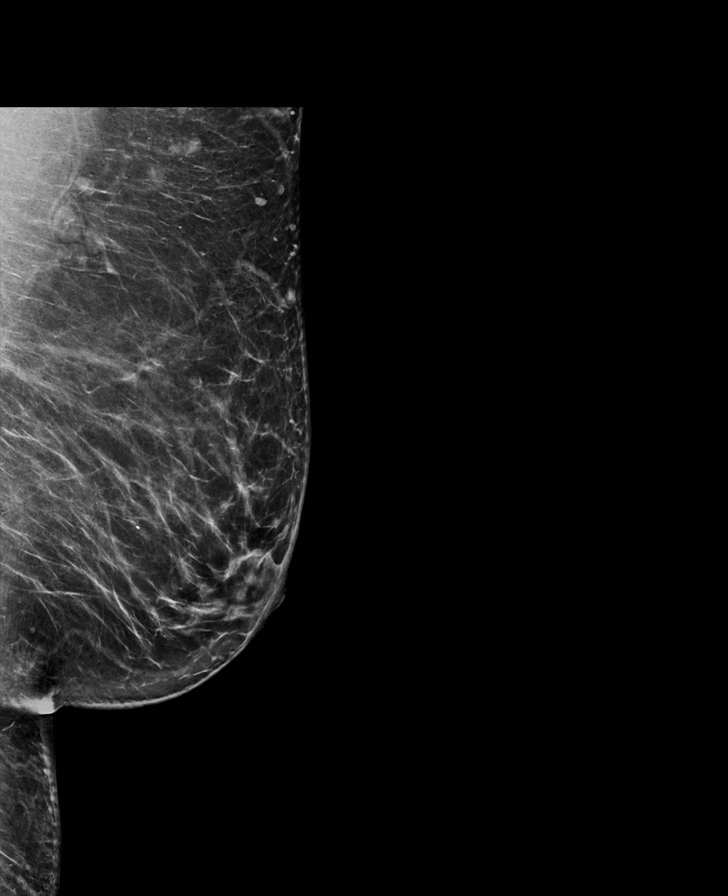

[L ML tomo · tomo slice 45/66.0]
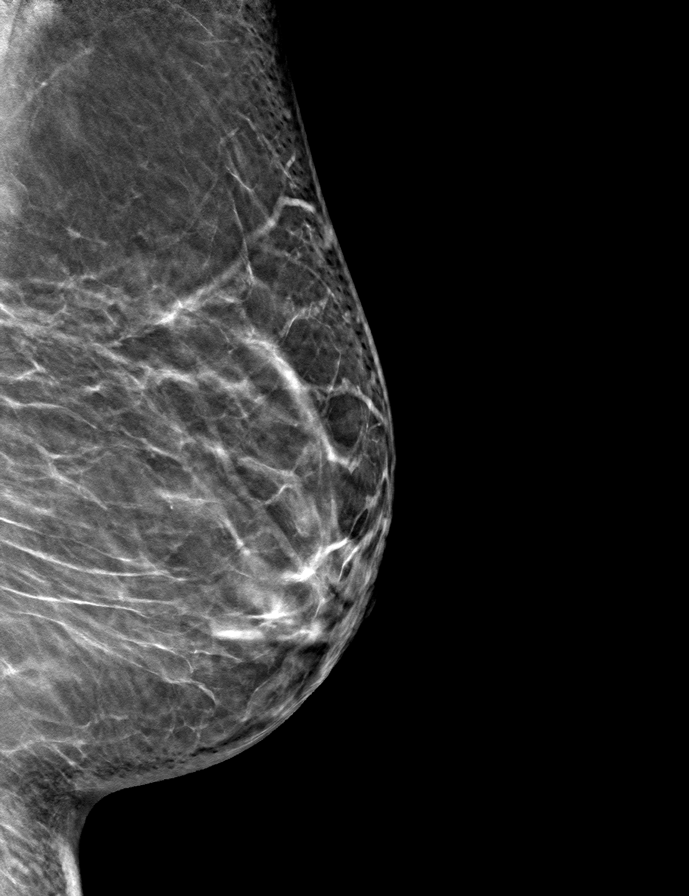

[8 of 40 positions shown; findings below may reference images not displayed]

ACR Breast Density Category b: There are scattered areas of
fibroglandular density.
FINDINGS: No concerning masses, calcifications or distortion identified within
either breast. Questioned asymmetry within the outer left breast on
the cc view partially effaced with additional imaging suggestive of
dense fibroglandular tissue.

On physical exam, no discrete mass is palpated within the outer left
breast.

Targeted ultrasound is performed, showing normal tissue without
suspicious mass within the outer left breast.
IMPRESSION: No mammographic evidence for malignancy.

RECOMMENDATION:
Screening mammogram in one year.(Code:E5-X-045)

I have discussed the findings and recommendations with the patient.
If applicable, a reminder letter will be sent to the patient
regarding the next appointment.

BI-RADS CATEGORY  1: Negative.

## 2022-04-19 IMAGING — US US BREAST*L* LIMITED INC AXILLA
1 series · 1 of 1 positions shown · non-contrast
Comparison: Previous exam(s).

CLINICAL DATA: Patient for delayed evaluation left breast asymmetry
recall.

EXAM:
DIGITAL DIAGNOSTIC BILATERAL MAMMOGRAM WITH TOMOSYNTHESIS AND CAD;
ULTRASOUND LEFT BREAST LIMITED
TECHNIQUE: Bilateral digital diagnostic mammography and breast tomosynthesis
was performed. The images were evaluated with computer-aided
detection.; Targeted ultrasound examination of the left breast was
performed

[Series 1: us breast*left* limited inc axilla · 0.06mm/px · 1 of 1 slices shown]
[im 1/1]
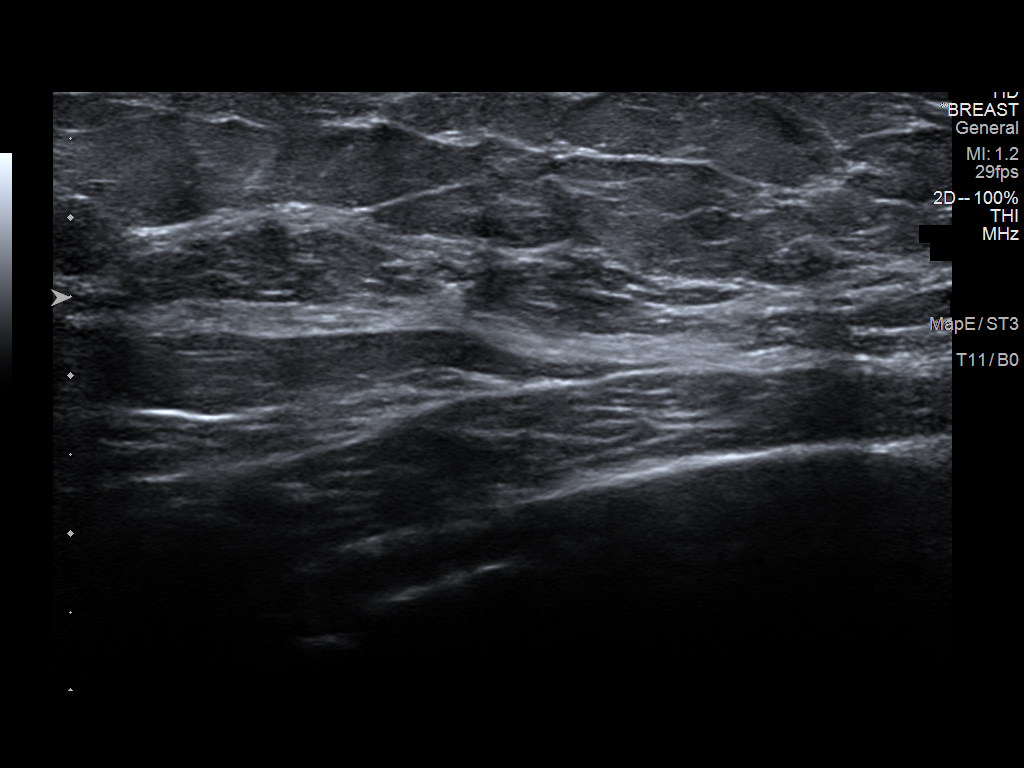

[1 of 1 positions shown; findings below may reference images not displayed]

ACR Breast Density Category b: There are scattered areas of
fibroglandular density.
FINDINGS: No concerning masses, calcifications or distortion identified within
either breast. Questioned asymmetry within the outer left breast on
the cc view partially effaced with additional imaging suggestive of
dense fibroglandular tissue.

On physical exam, no discrete mass is palpated within the outer left
breast.

Targeted ultrasound is performed, showing normal tissue without
suspicious mass within the outer left breast.
IMPRESSION: No mammographic evidence for malignancy.

RECOMMENDATION:
Screening mammogram in one year.(Code:E5-X-045)

I have discussed the findings and recommendations with the patient.
If applicable, a reminder letter will be sent to the patient
regarding the next appointment.

BI-RADS CATEGORY  1: Negative.

## 2022-04-20 ENCOUNTER — Ambulatory Visit
Admission: RE | Admit: 2022-04-20 | Discharge: 2022-04-20 | Disposition: A | Discharge status: Home / Self Care | Payer: No Typology Code available for payment source | Source: Ambulatory Visit | Attending: Internal Medicine | Admitting: Internal Medicine

## 2022-04-20 DIAGNOSIS — R0789 Other chest pain: Secondary | ICD-10-CM

## 2022-04-23 ENCOUNTER — Other Ambulatory Visit: Payer: Self-pay | Admitting: Internal Medicine

## 2022-04-23 DIAGNOSIS — R911 Solitary pulmonary nodule: Secondary | ICD-10-CM

## 2022-05-28 ENCOUNTER — Other Ambulatory Visit: Payer: 59

## 2022-07-02 ENCOUNTER — Ambulatory Visit
Admission: RE | Admit: 2022-07-02 | Discharge: 2022-07-02 | Disposition: A | Discharge status: Home / Self Care | Payer: 59 | Source: Ambulatory Visit | Attending: Internal Medicine | Admitting: Internal Medicine

## 2022-07-02 DIAGNOSIS — R911 Solitary pulmonary nodule: Secondary | ICD-10-CM

## 2022-07-27 ENCOUNTER — Other Ambulatory Visit: Payer: 59

## 2022-08-19 ENCOUNTER — Encounter: Payer: Self-pay | Admitting: Gastroenterology

## 2022-09-29 ENCOUNTER — Ambulatory Visit (AMBULATORY_SURGERY_CENTER): Payer: 59 | Admitting: *Deleted

## 2022-09-29 VITALS — Ht 61.0 in | Wt 191.0 lb

## 2022-09-29 DIAGNOSIS — Z86010 Personal history of colonic polyps: Secondary | ICD-10-CM

## 2022-09-29 MED ORDER — NA SULFATE-K SULFATE-MG SULF 17.5-3.13-1.6 GM/177ML PO SOLN: 1.0000 | Freq: Once | ORAL | 0 refills | Status: AC

## 2022-09-29 NOTE — Progress Notes (Signed)

## 2022-10-19 ENCOUNTER — Telehealth: Payer: Self-pay | Admitting: Gastroenterology

## 2022-10-19 NOTE — Telephone Encounter (Signed)
Thank you :)

## 2022-10-19 NOTE — Telephone Encounter (Signed)
Inbound call from patient stating she took Ozempic medication on Friday 9/27. Patient is scheduled for colonoscopy tomorrow 10/1. Patient requesting a call to discuss if she is able to proceed. Please advise, thank you.

## 2022-10-19 NOTE — Telephone Encounter (Signed)
Patient called and stated that she took her ozempic this past Friday which would only be 4 days brfore her procedure. After speaking to the doctor it was determined that we need to cancel this procedure and reschedule. Called patient back and rescheduled for 10/28/22, her instructions were reviewed with new dates and times. She wrote all of this down and understood them with no further questions. She knows to hold the ozempic and to call with any further questions or concerns.

## 2022-10-20 ENCOUNTER — Encounter: Payer: 59 | Admitting: Gastroenterology

## 2022-10-28 ENCOUNTER — Encounter: Payer: Self-pay | Admitting: Gastroenterology

## 2022-10-28 ENCOUNTER — Ambulatory Visit: Payer: 59 | Admitting: Gastroenterology

## 2022-10-28 VITALS — BP 126/66 | HR 55 | Temp 98.0°F | Resp 15 | Ht 61.0 in | Wt 191.0 lb

## 2022-10-28 DIAGNOSIS — D123 Benign neoplasm of transverse colon: Secondary | ICD-10-CM

## 2022-10-28 DIAGNOSIS — K635 Polyp of colon: Secondary | ICD-10-CM | POA: Diagnosis not present

## 2022-10-28 DIAGNOSIS — Z860101 Personal history of adenomatous and serrated colon polyps: Secondary | ICD-10-CM | POA: Diagnosis not present

## 2022-10-28 DIAGNOSIS — Z09 Encounter for follow-up examination after completed treatment for conditions other than malignant neoplasm: Secondary | ICD-10-CM | POA: Diagnosis not present

## 2022-10-28 DIAGNOSIS — Z860100 Personal history of colon polyps, unspecified: Secondary | ICD-10-CM

## 2022-10-28 DIAGNOSIS — D122 Benign neoplasm of ascending colon: Secondary | ICD-10-CM | POA: Diagnosis not present

## 2022-10-28 DIAGNOSIS — K621 Rectal polyp: Secondary | ICD-10-CM

## 2022-10-28 DIAGNOSIS — D128 Benign neoplasm of rectum: Secondary | ICD-10-CM

## 2022-10-28 MED ORDER — SODIUM CHLORIDE 0.9 % IV SOLN: 500.0000 mL | Freq: Once | INTRAVENOUS | Status: DC

## 2022-10-28 NOTE — Progress Notes (Unsigned)
Called to room to assist during endoscopic procedure.  Patient ID and intended procedure confirmed with present staff. Received instructions for my participation in the procedure from the performing physician.  

## 2022-10-28 NOTE — Patient Instructions (Addendum)
Resume previous diet.                           - Continue present medications.                           - Await pathology results.                           - Repeat colonoscopy in 3 - 5 years for                            surveillance based on pathology results.  Handout on polyps given.   YOU HAD AN ENDOSCOPIC PROCEDURE TODAY AT THE [facility]:   Refer to the procedure report that was given to you for any specific questions about what was found during the examination.  If the procedure report does not answer your questions, please call your gastroenterologist to clarify.  If you requested that your care partner not be given the details of your procedure findings, then the procedure report has been included in a sealed envelope for you to review at your convenience later.  YOU SHOULD EXPECT: Some feelings of bloating in the abdomen. Passage of more gas than usual.  Walking can help get rid of the air that was put into your GI tract during the procedure and reduce the bloating. If you had a lower endoscopy (such as a colonoscopy or flexible sigmoidoscopy) you may notice spotting of blood in your stool or on the toilet paper. If you underwent a bowel prep for your procedure, you may not have a normal bowel movement for a few days.  Please Note:  You might notice some irritation and congestion in your nose or some drainage.  This is from the oxygen used during your procedure.  There is no need for concern and it should clear up in a day or so.  SYMPTOMS TO REPORT IMMEDIATELY:  Following lower endoscopy (colonoscopy or flexible sigmoidoscopy):  Excessive amounts of blood in the stool  Significant tenderness or worsening of abdominal pains  Swelling of the abdomen that is new, acute  Fever of 100F or higher   For urgent or emergent issues, a gastroenterologist can be reached at any hour by calling [phone]. Do not use MyChart messaging for urgent concerns.    DIET:  We  do recommend a small meal at first, but then you may proceed to your regular diet.  Drink plenty of fluids but you should avoid alcoholic beverages for 24 hours.  ACTIVITY:  You should plan to take it easy for the rest of today and you should NOT DRIVE or use heavy machinery until tomorrow (because of the sedation medicines used during the test).    FOLLOW UP: Our staff will call the number listed on your records the next business day following your procedure.  We will call around 7:15- 8:00 am to check on you and address any questions or concerns that you may have regarding the information given to you following your procedure. If we do not reach you, we will leave a message.     If any biopsies were taken you will be contacted by phone or by letter within the next 1-3 weeks.  Please call us at [phone] if you have not heard about the biopsies  in 3 weeks.    SIGNATURES/CONFIDENTIALITY: You and/or your care partner have signed paperwork which will be entered into your electronic medical record.  These signatures attest to the fact that that the information above on your After Visit Summary has been reviewed and is understood.  Full responsibility of the confidentiality of this discharge information lies with you and/or your care-partner.

## 2022-10-28 NOTE — Progress Notes (Unsigned)
[facility] History and Physical   Primary Care Physician:  Creola Corn, MD   Reason for Procedure:  History of adenomatous colon polyps  Plan:    Surveillance colonoscopy with possible interventions as needed     HPI: Dorothy Torres is a very pleasant 67 y.o. female here for surveillance colonoscopy. Denies any nausea, vomiting, abdominal pain, melena or bright red blood per rectum  The risks and benefits as well as alternatives of endoscopic procedure(s) have been discussed and reviewed. All questions answered. The patient agrees to proceed.    Past Medical History:  Diagnosis Date   Allergy    Anemia    Anxiety    RIGHT HIP,LEG   Arthritis    Asthma    Constipation    hard to pass her stool    Diabetes mellitus without complication (HCC)    Gout    Hyperlipidemia    Hypertension    Obesity    Sleep difficulties    TIA (transient ischemic attack)    X 3    Past Surgical History:  Procedure Laterality Date   COLONOSCOPY     KNEE ARTHROSCOPY Left    POLYPECTOMY     trauma lower abdomen      Prior to Admission medications   Medication Sig Start Date End Date Taking? Authorizing Provider  allopurinol (ZYLOPRIM) 100 MG tablet Take 300 mg by mouth daily. 06/07/17  Yes [provider]  amLODipine (NORVASC) 5 MG tablet Take 1 tablet (5 mg total) by mouth daily. Patient taking differently: Take 10 mg by mouth daily. 08/05/17  Yes Regalado, Belkys A, MD  Cholecalciferol (VITAMIN D-3) 1000 units CAPS Take 1,000 Units by mouth daily.   Yes [provider]  hydrochlorothiazide (HYDRODIURIL) 25 MG tablet Take 1 tablet (25 mg total) by mouth daily. 08/06/17  Yes Regalado, Belkys A, MD  metFORMIN (GLUCOPHAGE) 500 MG tablet Take 500 mg by mouth daily.   Yes [provider]  Multiple Vitamins-Calcium (ONE-A-DAY WOMENS PO) Take 1 tablet by mouth daily.    Yes [provider]  rosuvastatin (CRESTOR) 5 MG tablet Take 5 mg by mouth  every Monday, Wednesday, and Friday. TAKE 4 X A WEEK 06/15/17  Yes [provider]  VITAMIN K PO Take 1 tablet by mouth daily.   Yes [provider]  albuterol (VENTOLIN HFA) 108 (90 Base) MCG/ACT inhaler INHALE 2 PUFFS BY MOUTH TWICE A DAY AS NEEDED Inhalation as directed for 90 days    [provider]  OZEMPIC, 0.25 OR 0.5 MG/DOSE, 2 MG/3ML SOPN Inject into the skin.    [provider]    Current Outpatient Medications  Medication Sig Dispense Refill   allopurinol (ZYLOPRIM) 100 MG tablet Take 300 mg by mouth daily.  5   amLODipine (NORVASC) 5 MG tablet Take 1 tablet (5 mg total) by mouth daily. (Patient taking differently: Take 10 mg by mouth daily.) 30 tablet 0   Cholecalciferol (VITAMIN D-3) 1000 units CAPS Take 1,000 Units by mouth daily.     hydrochlorothiazide (HYDRODIURIL) 25 MG tablet Take 1 tablet (25 mg total) by mouth daily. 30 tablet 0   metFORMIN (GLUCOPHAGE) 500 MG tablet Take 500 mg by mouth daily.     Multiple Vitamins-Calcium (ONE-A-DAY WOMENS PO) Take 1 tablet by mouth daily.      rosuvastatin (CRESTOR) 5 MG tablet Take 5 mg by mouth every Monday, Wednesday, and Friday. TAKE 4 X A WEEK  3   VITAMIN K PO  Take 1 tablet by mouth daily.     albuterol (VENTOLIN HFA) 108 (90 Base) MCG/ACT inhaler INHALE 2 PUFFS BY MOUTH TWICE A DAY AS NEEDED Inhalation as directed for 90 days     OZEMPIC, 0.25 OR 0.5 MG/DOSE, 2 MG/3ML SOPN Inject into the skin.     Current Facility-Administered Medications  Medication Dose Route Frequency Provider Last Rate Last Admin   0.9 %  sodium chloride infusion  500 mL Intravenous Once Napoleon Form, MD        Allergies as of 10/28/2022 - Review Complete 10/28/2022  Allergen Reaction Noted   Statins Other (See Comments) 07/20/2013    Family History  Problem Relation Age of Onset   High blood pressure Mother    Diabetes Mother    Kidney disease Mother    Colon polyps Mother    Heart attack Father    High  blood pressure Father    Breast cancer Paternal Grandmother    Colon polyps Other        cousins x multiple - maternal side    Colon cancer Neg Hx    Rectal cancer Neg Hx    Stomach cancer Neg Hx    Crohn's disease Neg Hx    Esophageal cancer Neg Hx    Ulcerative colitis Neg Hx     Social History   Socioeconomic History   Marital status: Single    Spouse name: Not on file   Number of children: 1   Years of education: college   Highest education level: Not on file  Occupational History   Not on file  Tobacco Use   Smoking status: Never   Smokeless tobacco: Never  Vaping Use   Vaping status: Never Used  Substance and Sexual Activity   Alcohol use: No    Alcohol/week: 0.0 standard drinks of alcohol   Drug use: No   Sexual activity: Not on file  Other Topics Concern   Not on file  Social History Narrative   Patient lives at home alone. Patient is single.   Patient works part time.   Education BA. College.   Right handed.   Caffeine two cups of coffee daily.   Social Determinants of Health   Financial Resource Strain: Not on file  Food Insecurity: Not on file  Transportation Needs: Not on file  Physical Activity: Not on file  Stress: Not on file  Social Connections: Not on file  Intimate Partner Violence: Not on file    Review of Systems:  All other review of systems negative except as mentioned in the HPI.  Physical Exam: Vital signs in last 24 hours: BP (!) 147/64   Pulse 62   Temp 98 F (36.7 C)   Resp 13   Ht 5\' 1"  (1.549 m)   Wt 191 lb (86.6 kg)   SpO2 100%   BMI 36.09 kg/m  General:   Alert, NAD Lungs:  Clear .   Heart:  Regular rate and rhythm Abdomen:  Soft, nontender and nondistended. Neuro/Psych:  Alert and cooperative. Normal mood and affect. A and O x 3  Reviewed labs, radiology imaging, old records and pertinent past GI work up  Patient is appropriate for planned procedure(s) and anesthesia in an ambulatory setting   K. Scherry Ran , MD [phone]

## 2022-10-28 NOTE — Op Note (Signed)
[facility] Patient Name: Dorothy Torres Procedure Date: 10/28/2022 8:35 AM MRN: [id number] Endoscopist: Napoleon Form , MD, [id number] Age: 67 Referring MD:  Date of Birth: February 14, 1955 Gender: Female Account #: [id number] Procedure:                Colonoscopy Indications:              High risk colon cancer surveillance: Personal                            history of colonic polyps, High risk colon cancer                            surveillance: Personal history of adenoma less than                            10 mm in size Medicines:                Monitored Anesthesia Care Procedure:                Pre-Anesthesia Assessment:                           - Prior to the procedure, a History and Physical                            was performed, and patient medications and                            allergies were reviewed. The patient's tolerance of                            previous anesthesia was also reviewed. The risks                            and benefits of the procedure and the sedation                            options and risks were discussed with the patient.                            All questions were answered, and informed consent                            was obtained. Prior Anticoagulants: The patient has                            taken no anticoagulant or antiplatelet agents. ASA                            Grade Assessment: II - A patient with mild systemic                            disease. After reviewing the risks and benefits,  the patient was deemed in satisfactory condition to                            undergo the procedure.                           After obtaining informed consent, the colonoscope                            was passed under direct vision. Throughout the                            procedure, the patient's blood pressure, pulse, and                            oxygen saturations were monitored  continuously. The                            Olympus Scope SN: [id number] was introduced through                            the anus and advanced to the the cecum, identified                            by appendiceal orifice and ileocecal valve. The                            colonoscopy was performed without difficulty. The                            patient tolerated the procedure well. The quality                            of the bowel preparation was good. The ileocecal                            valve, appendiceal orifice, and rectum were                            photographed. Scope In: 8:42:01 AM Scope Out: 8:56:38 AM Scope Withdrawal Time: 0 hours 10 minutes 54 seconds  Total Procedure Duration: 0 hours 14 minutes 37 seconds  Findings:                 The perianal and digital rectal examinations were                            normal.                           Three sessile polyps were found in the rectum,                            transverse colon and ascending colon. The polyps  were 5 to 8 mm in size. These polyps were removed                            with a cold snare. Resection and retrieval were                            complete.                           Non-bleeding external and internal hemorrhoids were                            found during retroflexion. The hemorrhoids were                            medium-sized. Complications:            No immediate complications. Estimated Blood Loss:     Estimated blood loss was minimal. Impression:               - Three 5 to 8 mm polyps in the rectum, in the                            transverse colon and in the ascending colon,                            removed with a cold snare. Resected and retrieved.                           - Non-bleeding external and internal hemorrhoids. Recommendation:           - Patient has a contact number available for                            emergencies. The signs  and symptoms of potential                            delayed complications were discussed with the                            patient. Return to normal activities tomorrow.                            Written discharge instructions were provided to the                            patient.                           - Resume previous diet.                           - Continue present medications.                           - Await pathology results.                           -  Repeat colonoscopy in 3 - 5 years for                            surveillance based on pathology results. Napoleon Form, MD 10/28/2022 8:59:58 AM This report has been signed electronically.

## 2022-10-28 NOTE — Progress Notes (Unsigned)
Report to PACU, RN, vss, BBS= Clear.  

## 2022-10-28 NOTE — Progress Notes (Signed)
Pt's states no medical or surgical changes since previsit or office visit. 

## 2022-10-29 ENCOUNTER — Telehealth: Payer: Self-pay | Admitting: *Deleted

## 2022-10-29 ENCOUNTER — Encounter: Payer: Self-pay | Admitting: Gastroenterology

## 2022-10-29 NOTE — Telephone Encounter (Signed)
Post procedure follow up phone call. No answer at number given.  Left message on voicemail.  

## 2022-10-30 LAB — SURGICAL PATHOLOGY

## 2022-11-04 ENCOUNTER — Encounter: Payer: Self-pay | Admitting: Gastroenterology

## 2022-11-20 ENCOUNTER — Telehealth: Payer: Self-pay | Admitting: Gastroenterology

## 2022-11-20 NOTE — Telephone Encounter (Signed)
Inbound call from patient calling in regards to pathology letter received in the mail. Patient states she is concerned and has questions.

## 2022-11-20 NOTE — Telephone Encounter (Signed)
Call placed to the pt and we discussed the pathology letter at length. The pt has no further questions at this time.  She will reach out to our office if needed prior to the recall colon in 3 years
